# Patient Record
Sex: Male | Born: 1945 | Race: White | Hispanic: No | Marital: Married | Smoking: Former smoker
Health system: Southern US, Community
[De-identification: ages and names within clinical notes are randomized; demographics above are authoritative.]

## PROBLEM LIST (undated history)

## (undated) DIAGNOSIS — Z95810 Presence of automatic (implantable) cardiac defibrillator: Secondary | ICD-10-CM

## (undated) DIAGNOSIS — I10 Essential (primary) hypertension: Secondary | ICD-10-CM

## (undated) DIAGNOSIS — I4901 Ventricular fibrillation: Secondary | ICD-10-CM

## (undated) DIAGNOSIS — E119 Type 2 diabetes mellitus without complications: Secondary | ICD-10-CM

## (undated) DIAGNOSIS — I2510 Atherosclerotic heart disease of native coronary artery without angina pectoris: Secondary | ICD-10-CM

## (undated) DIAGNOSIS — I469 Cardiac arrest, cause unspecified: Secondary | ICD-10-CM

## (undated) DIAGNOSIS — Z950 Presence of cardiac pacemaker: Secondary | ICD-10-CM

## (undated) HISTORY — PX: VASCULAR SURGERY: SHX849

## (undated) HISTORY — PX: CARDIAC CATHETERIZATION: SHX172

## (undated) HISTORY — PX: INSERT / REPLACE / REMOVE PACEMAKER: SUR710

## (undated) HISTORY — PX: PACEMAKER INSERTION: SHX728

## (undated) HISTORY — PX: CORONARY ARTERY BYPASS GRAFT: SHX141

---

## 2016-02-22 ENCOUNTER — Encounter (HOSPITAL_COMMUNITY)
Admission: EM | Disposition: A | Discharge status: Home Health | Payer: Self-pay | Source: Home / Self Care | Attending: Internal Medicine

## 2016-02-22 ENCOUNTER — Inpatient Hospital Stay (HOSPITAL_COMMUNITY): Payer: Medicare HMO

## 2016-02-22 ENCOUNTER — Encounter (HOSPITAL_COMMUNITY): Payer: Self-pay | Admitting: Emergency Medicine

## 2016-02-22 ENCOUNTER — Inpatient Hospital Stay (HOSPITAL_COMMUNITY)
Admission: EM | Admit: 2016-02-22 | Discharge: 2016-03-02 | DRG: 280 | Disposition: A | Discharge status: Home Health | Payer: Medicare HMO | Attending: Internal Medicine | Admitting: Internal Medicine

## 2016-02-22 DIAGNOSIS — I130 Hypertensive heart and chronic kidney disease with heart failure and stage 1 through stage 4 chronic kidney disease, or unspecified chronic kidney disease: Secondary | ICD-10-CM | POA: Diagnosis present

## 2016-02-22 DIAGNOSIS — E1165 Type 2 diabetes mellitus with hyperglycemia: Secondary | ICD-10-CM | POA: Diagnosis present

## 2016-02-22 DIAGNOSIS — Z91041 Radiographic dye allergy status: Secondary | ICD-10-CM | POA: Diagnosis not present

## 2016-02-22 DIAGNOSIS — Z7902 Long term (current) use of antithrombotics/antiplatelets: Secondary | ICD-10-CM

## 2016-02-22 DIAGNOSIS — E875 Hyperkalemia: Secondary | ICD-10-CM | POA: Diagnosis present

## 2016-02-22 DIAGNOSIS — I469 Cardiac arrest, cause unspecified: Secondary | ICD-10-CM | POA: Diagnosis present

## 2016-02-22 DIAGNOSIS — I2584 Coronary atherosclerosis due to calcified coronary lesion: Secondary | ICD-10-CM | POA: Diagnosis present

## 2016-02-22 DIAGNOSIS — N183 Chronic kidney disease, stage 3 (moderate): Secondary | ICD-10-CM | POA: Diagnosis present

## 2016-02-22 DIAGNOSIS — D649 Anemia, unspecified: Secondary | ICD-10-CM | POA: Diagnosis present

## 2016-02-22 DIAGNOSIS — E1122 Type 2 diabetes mellitus with diabetic chronic kidney disease: Secondary | ICD-10-CM | POA: Diagnosis present

## 2016-02-22 DIAGNOSIS — G9340 Encephalopathy, unspecified: Secondary | ICD-10-CM | POA: Diagnosis present

## 2016-02-22 DIAGNOSIS — I462 Cardiac arrest due to underlying cardiac condition: Secondary | ICD-10-CM | POA: Diagnosis present

## 2016-02-22 DIAGNOSIS — J9600 Acute respiratory failure, unspecified whether with hypoxia or hypercapnia: Secondary | ICD-10-CM | POA: Insufficient documentation

## 2016-02-22 DIAGNOSIS — I2510 Atherosclerotic heart disease of native coronary artery without angina pectoris: Secondary | ICD-10-CM

## 2016-02-22 DIAGNOSIS — E871 Hypo-osmolality and hyponatremia: Secondary | ICD-10-CM | POA: Diagnosis present

## 2016-02-22 DIAGNOSIS — I25810 Atherosclerosis of coronary artery bypass graft(s) without angina pectoris: Secondary | ICD-10-CM | POA: Diagnosis present

## 2016-02-22 DIAGNOSIS — Z7982 Long term (current) use of aspirin: Secondary | ICD-10-CM

## 2016-02-22 DIAGNOSIS — I5023 Acute on chronic systolic (congestive) heart failure: Secondary | ICD-10-CM | POA: Diagnosis not present

## 2016-02-22 DIAGNOSIS — I255 Ischemic cardiomyopathy: Secondary | ICD-10-CM | POA: Diagnosis present

## 2016-02-22 DIAGNOSIS — Z6827 Body mass index (BMI) 27.0-27.9, adult: Secondary | ICD-10-CM

## 2016-02-22 DIAGNOSIS — I4901 Ventricular fibrillation: Secondary | ICD-10-CM | POA: Diagnosis present

## 2016-02-22 DIAGNOSIS — I509 Heart failure, unspecified: Secondary | ICD-10-CM | POA: Diagnosis not present

## 2016-02-22 DIAGNOSIS — R1310 Dysphagia, unspecified: Secondary | ICD-10-CM | POA: Diagnosis present

## 2016-02-22 DIAGNOSIS — Z794 Long term (current) use of insulin: Secondary | ICD-10-CM

## 2016-02-22 DIAGNOSIS — N179 Acute kidney failure, unspecified: Secondary | ICD-10-CM | POA: Diagnosis present

## 2016-02-22 DIAGNOSIS — J690 Pneumonitis due to inhalation of food and vomit: Secondary | ICD-10-CM | POA: Diagnosis not present

## 2016-02-22 DIAGNOSIS — Z95810 Presence of automatic (implantable) cardiac defibrillator: Secondary | ICD-10-CM

## 2016-02-22 DIAGNOSIS — E872 Acidosis: Secondary | ICD-10-CM | POA: Diagnosis present

## 2016-02-22 DIAGNOSIS — J9690 Respiratory failure, unspecified, unspecified whether with hypoxia or hypercapnia: Secondary | ICD-10-CM | POA: Insufficient documentation

## 2016-02-22 DIAGNOSIS — E669 Obesity, unspecified: Secondary | ICD-10-CM | POA: Diagnosis present

## 2016-02-22 DIAGNOSIS — I25709 Atherosclerosis of coronary artery bypass graft(s), unspecified, with unspecified angina pectoris: Secondary | ICD-10-CM | POA: Diagnosis not present

## 2016-02-22 DIAGNOSIS — Z452 Encounter for adjustment and management of vascular access device: Secondary | ICD-10-CM

## 2016-02-22 DIAGNOSIS — Z7951 Long term (current) use of inhaled steroids: Secondary | ICD-10-CM | POA: Diagnosis not present

## 2016-02-22 DIAGNOSIS — I472 Ventricular tachycardia: Secondary | ICD-10-CM | POA: Diagnosis present

## 2016-02-22 DIAGNOSIS — Z951 Presence of aortocoronary bypass graft: Secondary | ICD-10-CM | POA: Diagnosis not present

## 2016-02-22 DIAGNOSIS — I25110 Atherosclerotic heart disease of native coronary artery with unstable angina pectoris: Secondary | ICD-10-CM | POA: Diagnosis not present

## 2016-02-22 DIAGNOSIS — J9601 Acute respiratory failure with hypoxia: Secondary | ICD-10-CM | POA: Diagnosis present

## 2016-02-22 DIAGNOSIS — I214 Non-ST elevation (NSTEMI) myocardial infarction: Secondary | ICD-10-CM | POA: Diagnosis present

## 2016-02-22 DIAGNOSIS — R9401 Abnormal electroencephalogram [EEG]: Secondary | ICD-10-CM | POA: Diagnosis not present

## 2016-02-22 HISTORY — PX: CARDIAC CATHETERIZATION: SHX172

## 2016-02-22 HISTORY — DX: Ventricular fibrillation: I49.01

## 2016-02-22 HISTORY — DX: Essential (primary) hypertension: I10

## 2016-02-22 HISTORY — DX: Cardiac arrest, cause unspecified: I46.9

## 2016-02-22 HISTORY — DX: Presence of automatic (implantable) cardiac defibrillator: Z95.810

## 2016-02-22 HISTORY — DX: Atherosclerotic heart disease of native coronary artery without angina pectoris: I25.10

## 2016-02-22 HISTORY — DX: Presence of cardiac pacemaker: Z95.0

## 2016-02-22 HISTORY — DX: Type 2 diabetes mellitus without complications: E11.9

## 2016-02-22 LAB — I-STAT CHEM 8, ED
BUN: 39 mg/dL — ABNORMAL HIGH (ref 6–20)
CALCIUM ION: 1.14 mmol/L (ref 1.13–1.30)
Chloride: 103 mmol/L (ref 101–111)
Creatinine, Ser: 1.7 mg/dL — ABNORMAL HIGH (ref 0.61–1.24)
Glucose, Bld: 510 mg/dL — ABNORMAL HIGH (ref 65–99)
HCT: 39 % (ref 39.0–52.0)
Hemoglobin: 13.3 g/dL (ref 13.0–17.0)
Potassium: 6.6 mmol/L (ref 3.5–5.1)
Sodium: 133 mmol/L — ABNORMAL LOW (ref 135–145)
TCO2: 19 mmol/L (ref 0–100)

## 2016-02-22 LAB — CBC
HCT: 34.9 % — ABNORMAL LOW (ref 39.0–52.0)
HEMOGLOBIN: 11.1 g/dL — AB (ref 13.0–17.0)
MCH: 30.5 pg (ref 26.0–34.0)
MCHC: 31.8 g/dL (ref 30.0–36.0)
MCV: 95.9 fL (ref 78.0–100.0)
Platelets: 174 10*3/uL (ref 150–400)
RBC: 3.64 MIL/uL — AB (ref 4.22–5.81)
RDW: 14.4 % (ref 11.5–15.5)
WBC: 16 10*3/uL — AB (ref 4.0–10.5)

## 2016-02-22 LAB — POCT I-STAT, CHEM 8
BUN: 37 mg/dL — AB (ref 6–20)
BUN: 39 mg/dL — ABNORMAL HIGH (ref 6–20)
CHLORIDE: 100 mmol/L — AB (ref 101–111)
CHLORIDE: 101 mmol/L (ref 101–111)
Calcium, Ion: 1.19 mmol/L (ref 1.13–1.30)
Calcium, Ion: 1.22 mmol/L (ref 1.13–1.30)
Creatinine, Ser: 1.6 mg/dL — ABNORMAL HIGH (ref 0.61–1.24)
Creatinine, Ser: 1.7 mg/dL — ABNORMAL HIGH (ref 0.61–1.24)
GLUCOSE: 534 mg/dL — AB (ref 65–99)
Glucose, Bld: 613 mg/dL (ref 65–99)
HCT: 37 % — ABNORMAL LOW (ref 39.0–52.0)
HEMATOCRIT: 35 % — AB (ref 39.0–52.0)
Hemoglobin: 11.9 g/dL — ABNORMAL LOW (ref 13.0–17.0)
Hemoglobin: 12.6 g/dL — ABNORMAL LOW (ref 13.0–17.0)
POTASSIUM: 6.4 mmol/L — AB (ref 3.5–5.1)
Potassium: 6.4 mmol/L (ref 3.5–5.1)
SODIUM: 130 mmol/L — AB (ref 135–145)
SODIUM: 132 mmol/L — AB (ref 135–145)
TCO2: 22 mmol/L (ref 0–100)
TCO2: 21 mmol/L (ref 0–100)

## 2016-02-22 LAB — POCT I-STAT 3, ART BLOOD GAS (G3+)
Acid-base deficit: 6 mmol/L — ABNORMAL HIGH (ref 0.0–2.0)
Acid-base deficit: 8 mmol/L — ABNORMAL HIGH (ref 0.0–2.0)
Bicarbonate: 20 mEq/L (ref 20.0–24.0)
Bicarbonate: 20.6 mEq/L (ref 20.0–24.0)
O2 SAT: 100 %
O2 Saturation: 99 %
PCO2 ART: 35.2 mmHg (ref 35.0–45.0)
PCO2 ART: 48 mmHg — AB (ref 35.0–45.0)
PH ART: 7.227 — AB (ref 7.350–7.450)
PH ART: 7.351 (ref 7.350–7.450)
PO2 ART: 134 mmHg — AB (ref 80.0–100.0)
PO2 ART: 361 mmHg — AB (ref 80.0–100.0)
Patient temperature: 32.2
TCO2: 21 mmol/L (ref 0–100)
TCO2: 22 mmol/L (ref 0–100)

## 2016-02-22 LAB — I-STAT TROPONIN, ED: Troponin i, poc: 0.1 ng/mL (ref 0.00–0.08)

## 2016-02-22 LAB — PROTIME-INR
INR: 1.31 (ref 0.00–1.49)
Prothrombin Time: 16.4 seconds — ABNORMAL HIGH (ref 11.6–15.2)

## 2016-02-22 LAB — APTT: APTT: 41 s — AB (ref 24–37)

## 2016-02-22 SURGERY — LEFT HEART CATH AND CORONARY ANGIOGRAPHY
Anesthesia: Moderate Sedation

## 2016-02-22 MED ORDER — SODIUM CHLORIDE 0.9 % IV SOLN
INTRAVENOUS | Status: AC
  Administered 2016-02-22: via INTRAVENOUS

## 2016-02-22 MED ORDER — SODIUM CHLORIDE 0.9% FLUSH
3.0000 mL | Freq: Two times a day (BID) | INTRAVENOUS | Status: DC
  Administered 2016-02-23 – 2016-02-25 (×6): 3 mL via INTRAVENOUS

## 2016-02-22 MED ORDER — IOPAMIDOL (ISOVUE-370) INJECTION 76%
INTRAVENOUS | Status: DC | PRN
  Administered 2016-02-22: 75 mL via INTRA_ARTERIAL

## 2016-02-22 MED ORDER — HEPARIN SODIUM (PORCINE) 5000 UNIT/ML IJ SOLN: 5000.0000 [IU] | Freq: Three times a day (TID) | INTRAMUSCULAR | Status: DC

## 2016-02-22 MED ORDER — FENTANYL CITRATE (PF) 100 MCG/2ML IJ SOLN
50.0000 ug | Freq: Once | INTRAMUSCULAR | Status: AC
  Administered 2016-02-22: 50 ug via INTRAVENOUS

## 2016-02-22 MED ORDER — NITROGLYCERIN 0.4 MG SL SUBL: 0.4000 mg | SUBLINGUAL_TABLET | SUBLINGUAL | Status: DC | PRN

## 2016-02-22 MED ORDER — CISATRACURIUM BOLUS VIA INFUSION
0.0500 mg/kg | INTRAVENOUS | Status: AC | PRN
  Administered 2016-02-22: 4.4 mg via INTRAVENOUS
  Filled 2016-02-22: qty 5

## 2016-02-22 MED ORDER — SODIUM CHLORIDE 0.9 % IV SOLN
10.0000 ug/h | INTRAVENOUS | Status: DC
  Administered 2016-02-22: 10 ug/h via INTRAVENOUS
  Filled 2016-02-22: qty 50

## 2016-02-22 MED ORDER — MIDAZOLAM HCL 2 MG/2ML IJ SOLN
INTRAMUSCULAR | Status: DC | PRN
  Administered 2016-02-22: 2 mg via INTRAVENOUS
  Administered 2016-02-22 (×2): 1 mg via INTRAVENOUS

## 2016-02-22 MED ORDER — PANTOPRAZOLE SODIUM 40 MG IV SOLR
40.0000 mg | Freq: Every day | INTRAVENOUS | Status: DC
  Administered 2016-02-23 – 2016-02-27 (×5): 40 mg via INTRAVENOUS
  Filled 2016-02-22 (×5): qty 40

## 2016-02-22 MED ORDER — ONDANSETRON HCL 4 MG/2ML IJ SOLN
4.0000 mg | Freq: Four times a day (QID) | INTRAMUSCULAR | Status: DC | PRN
  Administered 2016-02-26 – 2016-02-27 (×4): 4 mg via INTRAVENOUS
  Filled 2016-02-22 (×5): qty 2

## 2016-02-22 MED ORDER — SODIUM CHLORIDE 0.9 % IV SOLN
100.0000 ug/h | INTRAVENOUS | Status: DC
  Administered 2016-02-23 (×2): 200 ug/h via INTRAVENOUS
  Filled 2016-02-22 (×4): qty 50

## 2016-02-22 MED ORDER — ASPIRIN EC 81 MG PO TBEC
81.0000 mg | DELAYED_RELEASE_TABLET | Freq: Every day | ORAL | Status: DC
  Administered 2016-02-23 – 2016-03-02 (×8): 81 mg via ORAL
  Filled 2016-02-22 (×9): qty 1

## 2016-02-22 MED ORDER — ALBUTEROL SULFATE (2.5 MG/3ML) 0.083% IN NEBU: 2.5000 mg | INHALATION_SOLUTION | RESPIRATORY_TRACT | Status: DC | PRN

## 2016-02-22 MED ORDER — LIDOCAINE HCL (PF) 1 % IJ SOLN
INTRAMUSCULAR | Status: DC | PRN
  Administered 2016-02-22: 14 mL via INTRADERMAL

## 2016-02-22 MED ORDER — ASPIRIN 300 MG RE SUPP
300.0000 mg | Freq: Once | RECTAL | Status: AC
  Administered 2016-02-22: 300 mg via RECTAL

## 2016-02-22 MED ORDER — NOREPINEPHRINE BITARTRATE 1 MG/ML IV SOLN
0.0000 ug/min | INTRAVENOUS | Status: DC
  Administered 2016-02-23: 3 ug/min via INTRAVENOUS
  Administered 2016-02-23: 4 ug/min via INTRAVENOUS
  Administered 2016-02-24: 5 ug/min via INTRAVENOUS
  Filled 2016-02-22 (×2): qty 4

## 2016-02-22 MED ORDER — INSULIN ASPART 100 UNIT/ML ~~LOC~~ SOLN
10.0000 [IU] | Freq: Once | SUBCUTANEOUS | Status: AC
  Administered 2016-02-22: 10 [IU] via INTRAVENOUS
  Filled 2016-02-22: qty 0.1

## 2016-02-22 MED ORDER — SODIUM CHLORIDE 0.9 % IV SOLN
2000.0000 mL | Freq: Once | INTRAVENOUS | Status: AC
  Administered 2016-02-22: 2000 mL via INTRAVENOUS

## 2016-02-22 MED ORDER — DEXTROSE 5 % IV SOLN
0.0000 ug/min | Freq: Once | INTRAVENOUS | Status: AC
  Filled 2016-02-22: qty 4

## 2016-02-22 MED ORDER — HEPARIN SODIUM (PORCINE) 5000 UNIT/ML IJ SOLN
4000.0000 [IU] | Freq: Once | INTRAMUSCULAR | Status: AC
  Administered 2016-02-22: 4000 [IU] via INTRAVENOUS

## 2016-02-22 MED ORDER — PROPOFOL 1000 MG/100ML IV EMUL
25.0000 ug/kg/min | INTRAVENOUS | Status: DC
  Administered 2016-02-22: 25 ug/kg/min via INTRAVENOUS
  Filled 2016-02-22: qty 100

## 2016-02-22 MED ORDER — SODIUM CHLORIDE 0.9 % IV SOLN
1.0000 mg/h | Freq: Once | INTRAVENOUS | Status: AC
  Administered 2016-02-22: 1 mg/h via INTRAVENOUS
  Filled 2016-02-22: qty 10

## 2016-02-22 MED ORDER — FENTANYL BOLUS VIA INFUSION
25.0000 ug | INTRAVENOUS | Status: DC | PRN
  Administered 2016-02-22 – 2016-02-24 (×2): 25 ug via INTRAVENOUS
  Filled 2016-02-22: qty 25

## 2016-02-22 MED ORDER — ATORVASTATIN CALCIUM 80 MG PO TABS
80.0000 mg | ORAL_TABLET | Freq: Every day | ORAL | Status: DC
  Administered 2016-02-23 – 2016-03-01 (×7): 80 mg via ORAL
  Filled 2016-02-22 (×8): qty 1

## 2016-02-22 MED ORDER — DEXTROSE 50 % IV SOLN
INTRAVENOUS | Status: DC | PRN
  Administered 2016-02-22: 1 via INTRAVENOUS

## 2016-02-22 MED ORDER — SODIUM CHLORIDE 0.9 % IV SOLN
INTRAVENOUS | Status: AC | PRN
  Administered 2016-02-22: 1000 mL via INTRAVENOUS

## 2016-02-22 MED ORDER — HEPARIN (PORCINE) IN NACL 2-0.9 UNIT/ML-% IJ SOLN
INTRAMUSCULAR | Status: DC | PRN
  Administered 2016-02-22: 1500 mL

## 2016-02-22 MED ORDER — ARTIFICIAL TEARS OP OINT
1.0000 "application " | TOPICAL_OINTMENT | Freq: Three times a day (TID) | OPHTHALMIC | Status: DC
  Administered 2016-02-22 – 2016-02-24 (×5): 1 via OPHTHALMIC
  Filled 2016-02-22: qty 3.5

## 2016-02-22 MED ORDER — SODIUM CHLORIDE 0.9 % IV SOLN: 250.0000 mL | INTRAVENOUS | Status: DC | PRN

## 2016-02-22 MED ORDER — SODIUM CHLORIDE 0.9 % IV SOLN
1.0000 ug/kg/min | INTRAVENOUS | Status: DC
  Administered 2016-02-23: 1.3 ug/kg/min via INTRAVENOUS
  Filled 2016-02-22 (×2): qty 20

## 2016-02-22 MED ORDER — CISATRACURIUM BOLUS VIA INFUSION
0.1000 mg/kg | Freq: Once | INTRAVENOUS | Status: AC
  Administered 2016-02-22: 8.9 mg via INTRAVENOUS
  Filled 2016-02-22: qty 9

## 2016-02-22 MED ORDER — SODIUM CHLORIDE 0.9% FLUSH: 3.0000 mL | INTRAVENOUS | Status: DC | PRN

## 2016-02-22 MED ORDER — SODIUM CHLORIDE 0.9 % IV SOLN: 1.0000 g | Freq: Once | INTRAVENOUS | Status: DC

## 2016-02-22 MED ORDER — FENTANYL CITRATE (PF) 100 MCG/2ML IJ SOLN
INTRAMUSCULAR | Status: DC | PRN
  Administered 2016-02-22: 25 ug via INTRAVENOUS
  Administered 2016-02-22: 50 ug via INTRAVENOUS
  Administered 2016-02-22: 25 ug via INTRAVENOUS

## 2016-02-22 MED ORDER — SODIUM CHLORIDE 0.9 % IV SOLN
INTRAVENOUS | Status: DC
  Administered 2016-02-22: 4.3 [IU]/h via INTRAVENOUS
  Filled 2016-02-22 (×2): qty 2.5

## 2016-02-22 SURGICAL SUPPLY — 8 items
CATH INFINITI 5FR MULTPACK ANG (CATHETERS) ×3 IMPLANT
KIT HEART LEFT (KITS) ×3 IMPLANT
PACK CARDIAC CATHETERIZATION (CUSTOM PROCEDURE TRAY) ×3 IMPLANT
SHEATH PINNACLE 6F 10CM (SHEATH) ×3 IMPLANT
SYR MEDRAD MARK V 150ML (SYRINGE) ×3 IMPLANT
TRANSDUCER W/STOPCOCK (MISCELLANEOUS) ×3 IMPLANT
TUBING CIL FLEX 10 FLL-RA (TUBING) ×3 IMPLANT
WIRE EMERALD 3MM-J .035X150CM (WIRE) ×3 IMPLANT

## 2016-02-22 NOTE — Consult Note (Addendum)
CARDIOLOGY CONSULT NOTE  Patient ID: Ryan Riggs, MRN: [id number], DOB/AGE: [birth year] 69 y.o. Admit date: 02/22/2016 Date of Consult: 02/22/2016  Primary Physician: No primary care provider on file. Primary Cardiologist: none Referring Physician: Dr Jodi Mourning  Chief Complaint: Cardiac Arrest Reason for Consultation: STEMI  HPI: 70 yo male with hx of CABG was at a [employer] this evening and was found down in the bathroom. He was administered bystander CPR with apparent ROSC. On EMS arrival the patient had a pulse but went into VT and required 3 shocks. He also received amiodarone. Initial EKG showed marked ST changes and a Code STEMI is called. The patient was intubated in the field and received versed and fentanyl for sedation. There is no other history available at this time. His CABG was apparently elsewhere and he has not been hospitalized in the [facility] before.   Medical History:  Past Medical History  Diagnosis Date  . Diabetes mellitus without complication (HCC)   . Hypertension   . Cardiac arrest (HCC) 02/22/2016  . Ventricular fibrillation (HCC) 02/22/2016      Surgical History:  Past Surgical History  Procedure Laterality Date  . Coronary artery bypass graft      x4  . Pacemaker insertion       Home Meds: Prior to Admission medications   Not on File    Inpatient Medications:  . norepinephrine (LEVOPHED) Adult infusion  0-40 mcg/min Intravenous Once   . fentaNYL infusion INTRAVENOUS 10 mcg/hr (02/22/16 2209)    Allergies: Allergies not on file  Social History   Social History  . Marital Status: Unknown    Spouse Name: N/A  . Number of Children: N/A  . Years of Education: N/A   Occupational History  . Not on file.   Social History Main Topics  . Smoking status: Not on file  . Smokeless tobacco: Not on file  . Alcohol Use: Not on file  . Drug Use: Not on file  . Sexual Activity: Not on file   Other Topics Concern  . Not on file   Social  History Narrative  . No narrative on file     Family History: unknown  Review of Systems: unable to obtain  Physical Exam: Blood pressure 139/79, pulse 67, SpO2 99 %. Pt is intubated, sedated, obese male HEENT: normal Neck: JVP normal - difficult to visualize Lungs: equal expansion, clear bilaterally CV: Apex is nonpalpable, RRR without murmur or gallop Abd: soft, +BS, no obvious masses Back: midline Ext: no edema Skin: warm and dry without rash Neuro: CNII-XII intact             Strength intact = bilaterally   Labs: No results for input(s): CKTOTAL, CKMB, TROPONINI in the last 72 hours. Lab Results  Component Value Date   HGB 11.9* 02/22/2016   HCT 35.0* 02/22/2016     Recent Labs Lab 02/22/16 2156  NA 130*  K 6.4*  CL 100*  BUN 37*  CREATININE 1.60*  GLUCOSE 613*   No results found for: CHOL, HDL, LDLCALC, TRIG No results found for: DDIMER  Radiology/Studies:  No results found.  EKG: NSR with nonspecific IVCD and marked anterior ST depression consider severe ischemia versus posterior infarction  Cardiac Studies: pending  ASSESSMENT AND PLAN:  1. Out-of-hospital cardiac arrest: pt resuscitated, now hemodynamically stable. Likely precipitated by cardiac ischemic event. Anticipate hypothermia.  2. NSTEMI: marked ST depression highly suggestive of severe ischemia. Emergency cardiac cath +/- PCI  3. Diabetes,  uncontrolled: start insulin gtt  4. Acute respiratory failure, secondary to #1: will consult CCM.   5. Hyperkalemia: given 10 units insulin in ER, will repeat. Kayexalate.   6. AKI: baseline creatinine unknown but suspect AKI secondary to critical illness  Dispo: pending cardiac cath  The patient is critically ill with multiple organ systems failure and requires high complexity decision making for assessment and support, frequent evaluation and titration of therapies, application of advanced monitoring technologies and extensive interpretation of  multiple databases.   Critical Care Time devoted to patient care services described in this note is 40 minutes  Signed, Tonny Bollmanooper, Daveah Varone MD, A Rosie PlaceFACC 02/22/2016, 10:13 PM  Addendum: Spoke with the patient's family after his cardiac catheterization. The patient has an ICD and when he collapsed, his ICD discharged 2 or 3 times and his son perform CPR which resuscitated him. The patient apparently has been feeling badly for about 2 weeks. He was hospitalized overnight for dehydration and fever without clear cause.  Tonny BollmanCooper, Zeke Aker 02/22/2016 10:58 PM

## 2016-02-22 NOTE — Code Documentation (Signed)
Pt arrives via EMS post arrest, was at a dance hall and went to bathroom - was found down by bystanders. CPR initiated by bystanders. Initial rhythm VT with pulses. Shocked 4 x total by EMS, 3 times at the scene at 100, 200 and 300 joules. 150MG  amiodarone drip started in the field. Given 1MG  versed and 50 mcg fentanyl given by EMS for sedation.

## 2016-02-22 NOTE — Procedures (Signed)
Arterial Catheter Insertion Procedure Note Ryan Riggs [id number] 07/03/1946  Procedure: Insertion of Arterial Catheter  Indications: Blood pressure monitoring  Procedure Details Consent: Unable to obtain consent because of emergent medical necessity. Time Out: Verified patient identification, verified procedure, site/side was marked, verified correct patient position, special equipment/implants available, medications/allergies/relevent history reviewed, required imaging and test results available.  Performed  Maximum sterile technique was used including antiseptics, cap, gloves, gown, hand hygiene, mask and sheet. Skin prep: Chlorhexidine; local anesthetic administered 20 gauge catheter was inserted into right radial artery using the Seldinger technique.  Evaluation Blood flow good; BP tracing good. Complications: No apparent complications.   Ryan Riggs, Ryan Riggs 02/22/2016

## 2016-02-22 NOTE — ED Notes (Signed)
Escorted pt to cath lab with Dr. Excell Seltzerooper. Bedside report given to cath lab team.

## 2016-02-22 NOTE — H&P (Signed)
PULMONARY / CRITICAL CARE MEDICINE   Name: Ryan Riggs MRN: [id number] DOB: [birth year]    ADMISSION DATE:  02/22/2016 CONSULTATION DATE:  02/22/16  REFERRING MD:  Dr. Excell Seltzer   CHIEF COMPLAINT:  VF Arrest   HISTORY OF PRESENT ILLNESS:  70 y/o M with PMH of DM II, CAD s/p CABG, HTN and pacemaker insertion who presented to [facility] on 5/13 after VF arrest.  He has never been hospitalized in the [facility] system.  Other medical history unknown.   The patient was reportedly at a barn dance and went to the restroom.  He was later found down by bystanders.  His son performed CPR.   He had been feeling poorly for approximately 2 weeks.  EMS activated with initial rhythm of VT.  He was shocked 2-3 times by his ICD.  EMS intubated.  He was treated with amiodarone and transported to ER for evaluation.  Initial EKG demonstrated ST changes.  The patient was taken directly to the cath lab where he was found to have graft stenosis, no interventions performed. The patient was returned to ICU for further management.  After arrival, he had an episode of vomiting concerning for aspiration.    PCCM called for ICU admission. Unknown medical, social history as no family available at time of admission.   PAST MEDICAL HISTORY :  He  has a past medical history of Diabetes mellitus without complication (HCC); Hypertension; Cardiac arrest (HCC) (02/22/2016); and Ventricular fibrillation (HCC) (02/22/2016).  PAST SURGICAL HISTORY: He  has past surgical history that includes Coronary artery bypass graft and Pacemaker insertion.  No Known Allergies  No current facility-administered medications on file prior to encounter.   No current outpatient prescriptions on file prior to encounter.    FAMILY HISTORY:  His has no family status information on file.   SOCIAL HISTORY: He    REVIEW OF SYSTEMS:  Unable to complete as patient is post arrest on ventilator and no family available at this time.  SUBJECTIVE:     VITAL SIGNS: BP 139/79 mmHg  Pulse 67  Temp(Src)   Ht  (1.778 m)  Wt 195 lb 1.7 oz (88.5 kg)  BMI 27.99 kg/m2  SpO2 99%  HEMODYNAMICS:    VENTILATOR SETTINGS: Vent Mode:  [-] PRVC FiO2 (%):  [70 %-100 %] 70 % Set Rate:  [16 bmp-20 bmp] 20 bmp Vt Set:  [580 mL] 580 mL PEEP:  [5 cmH20] 5 cmH20 Plateau Pressure:  [18 cmH20-21 cmH20] 21 cmH20  INTAKE / OUTPUT:    PHYSICAL EXAMINATION: General:  Elderly male in NAD on vent  Neuro:  Sedate, opens eyes when name called, no follow commands  HEENT:  MM pink/moist, ETT in place, no significant JVD Cardiovascular:  s1s2 rrr, distant heart tones, rrr, no m/r/g  Lungs:  Even/non-labored, lungs bilaterally clear  Abdomen:  Mild distention, bsx4 active  Musculoskeletal:  No acute deformities  Skin:  Warm/dry, no edema, I/O in RLE  LABS:  BMET  Recent Labs Lab 02/22/16 2118 02/22/16 2156  NA 133* 130*  K 6.6* 6.4*  CL 103 100*  BUN 39* 37*  CREATININE 1.70* 1.60*  GLUCOSE 510* 613*    Electrolytes No results for input(s): CALCIUM, MG, PHOS in the last 168 hours.  CBC  Recent Labs Lab 02/22/16 2118 02/22/16 2156  HGB 13.3 11.9*  HCT 39.0 35.0*    Coag's No results for input(s): APTT, INR in the last 168 hours.  Sepsis Markers No results for input(s): LATICACIDVEN,  PROCALCITON, O2SATVEN in the last 168 hours.  ABG  Recent Labs Lab 02/22/16 2156  PHART 7.227*  PCO2ART 48.0*  PO2ART 361.0*    Liver Enzymes No results for input(s): AST, ALT, ALKPHOS, BILITOT, ALBUMIN in the last 168 hours.  Cardiac Enzymes No results for input(s): TROPONINI, PROBNP in the last 168 hours.  Glucose No results for input(s): GLUCAP in the last 168 hours.  Imaging No results found.   STUDIES:  EEG 5/13 >>  ECHO 5/13 >>   CULTURES:   ANTIBIOTICS:   SIGNIFICANT EVENTS: 5/13  Admit with VT arrest, to cath lab, no interventions  LINES/TUBES: ETT 5/13 >>  R Rad Aline 5/13 >>   DISCUSSION: 70  y/o M with PMH of DM, CAD and prior CABG/AICD admitted 5/13 post VT arrest. To cath lab with prior graft disease identified, unable to perform interventions.  Returned to ICU for hypothermia protocol.  ASSESSMENT / PLAN:  PULMONARY A: Acute Respiratory Failure - in setting of cardiac arrest  Possible Aspiration - large volume emesis in ICU 5/13 pm P:   PRVC 8 cc/kg Wean PEEP / FiO2 for sats > 92% Now ABG, CXR Monitor CXR for evidence of aspiration Exchange ETT  PRN albuterol   CARDIOVASCULAR A:  VT Arrest  - out of hospital 5/13, AICD shock NSTEMI  P:  Cardiology following Hypothermia protocol  Levophed as needed for MAP >85 Continue ASA, lipitor Assess ECHO, trend troponin  ICU monitoring of hemodynamics  May need central access / currently hypertensive  RENAL A:   Acute Kidney Injury - unknown baseline AGMA - in setting of cardiac arrest  Hyperkalemia  Hyponatremia P:   Repeat CMP now Trend BMP / UOP  Replace electrolytes as indicated  Correct acidosis / hyperglycemia   GASTROINTESTINAL A:   Vomiting - 5/13 post cath lab P:   NPO  OGT to LIS PPI for SUP   HEMATOLOGIC A:   Anemia  P:  Trend CBC Heparin / SCD's for DVT prophylaxis   INFECTIOUS A:   Possible Aspiration - large volume emesis post LHC after return to ICU P:   Monitor CXR, fever curve, WBC Hold abx for now  ENDOCRINE A:   DM II - uncontrolled    P:   Insulin gtt per protocol CBG monitoring   NEUROLOGIC A:   Acute Encephalopathy - post cardiac arrest  P:   RASS goal: -5 while on hypothermia protocol  Propofol for sedation  Fentanyl for pain Assess EEG post arrest   FAMILY  - Updates: No family at bedside.    - Inter-disciplinary family meet or Palliative Care meeting due by:  5/20    Canary BrimBrandi Dayleen Beske, NP-C [facility] Pgr: [phone] or if no answer [phone] 02/22/2016, 11:07 PM

## 2016-02-22 NOTE — ED Provider Notes (Signed)
CSN: [id number]     Arrival date & time 02/22/16  2105 History   First MD Initiated Contact with Patient 02/22/16 2123     No chief complaint on file.    (Consider location/radiation/quality/duration/timing/severity/associated sxs/prior Treatment) The history is provided by the EMS personnel. The history is limited by the condition of the patient. No language interpreter was used.   Patient is a 70 year old male with history of diabetes and hypertension, CAD status post CABG 4 he presents after cardiac arrest. [facility] EMS [facility] and was found down in the bathroom by bystanders. Bystander CPR was initiated with apparent ROSC, and EMS was called. Initial rhythm on EMS arrival was ventricular tachycardia. Patient received a total of 3 shocks by EMS at 100, 200 300 J. Also received a 150 mg amiodarone load. He was intubated w/ 1 mg of Versed and 50 mcgs of fentanyl for sedation.  He has had one episode of emesis and was incontinent of stool. Code STEMI was initiated on arrival after EKG showed marked ST changes. Past Medical History  Diagnosis Date  . Diabetes mellitus without complication (HCC)   . Hypertension    Past Surgical History  Procedure Laterality Date  . Coronary artery bypass graft      x4  . Pacemaker insertion     No family history on file. Social History  Substance Use Topics  . Smoking status: None  . Smokeless tobacco: None  . Alcohol Use: None    Review of Systems  Unable to perform ROS: Intubated      Allergies  Review of patient's allergies indicates not on file.  Home Medications   Prior to Admission medications   Not on File   BP 127/51 mmHg  Pulse 39  SpO2 93% Physical Exam  Constitutional:  Intubated,  Unresponsive, incontinent of stool  HENT:  Head: Normocephalic and atraumatic.  Eyes: Pupils are equal, round, and reactive to light.  Cardiovascular: Regular rhythm and intact distal pulses.  Bradycardia present.    Pulmonary/Chest:  Coarse breath sounds bilaterally  Abdominal: Soft. He exhibits no distension.  Neurological:  Intubated, unresponsive    ED Course  Procedures (including critical care time) Labs Review Labs Reviewed  CBC - Abnormal; Notable for the following:    WBC 16.0 (*)    RBC 3.64 (*)    Hemoglobin 11.1 (*)    HCT 34.9 (*)    All other components within normal limits  CREATININE, SERUM - Abnormal; Notable for the following:    Creatinine, Ser 1.94 (*)    GFR calc non Af Amer 34 (*)    GFR calc Af Amer 39 (*)    All other components within normal limits  PROTIME-INR - Abnormal; Notable for the following:    Prothrombin Time 16.4 (*)    All other components within normal limits  APTT - Abnormal; Notable for the following:    aPTT 41 (*)    All other components within normal limits  LACTIC ACID, PLASMA - Abnormal; Notable for the following:    Lactic Acid, Venous 2.1 (*)    All other components within normal limits  GLUCOSE, CAPILLARY - Abnormal; Notable for the following:    Glucose-Capillary 490 (*)    All other components within normal limits  I-STAT CHEM 8, ED - Abnormal; Notable for the following:    Sodium 133 (*)    Potassium 6.6 (*)    BUN 39 (*)    Creatinine,  Ser 1.70 (*)    Glucose, Bld 510 (*)    All other components within normal limits  I-STAT TROPOININ, ED - Abnormal; Notable for the following:    Troponin i, poc 0.10 (*)    All other components within normal limits  POCT I-STAT, CHEM 8 - Abnormal; Notable for the following:    Sodium 130 (*)    Potassium 6.4 (*)    Chloride 100 (*)    BUN 37 (*)    Creatinine, Ser 1.60 (*)    Glucose, Bld 613 (*)    Hemoglobin 11.9 (*)    HCT 35.0 (*)    All other components within normal limits  POCT I-STAT 3, ART BLOOD GAS (G3+) - Abnormal; Notable for the following:    pH, Arterial 7.227 (*)    pCO2 arterial 48.0 (*)    pO2, Arterial 361.0 (*)    Acid-base deficit 8.0 (*)    All other components  within normal limits  POCT I-STAT 3, ART BLOOD GAS (G3+) - Abnormal; Notable for the following:    pO2, Arterial 134.0 (*)    Acid-base deficit 6.0 (*)    All other components within normal limits  POCT I-STAT, CHEM 8 - Abnormal; Notable for the following:    Sodium 132 (*)    Potassium 6.4 (*)    BUN 39 (*)    Creatinine, Ser 1.70 (*)    Glucose, Bld 534 (*)    Hemoglobin 12.6 (*)    HCT 37.0 (*)    All other components within normal limits  CBC  PROTIME-INR  APTT  BLOOD GAS, ARTERIAL  BLOOD GAS, ARTERIAL  MAGNESIUM  PHOSPHORUS  COMPREHENSIVE METABOLIC PANEL  LIPID PANEL  TROPONIN I  LACTIC ACID, PLASMA    Imaging Review No results found. I have personally reviewed and evaluated these images and lab results as part of my medical decision-making.   EKG Interpretation None      MDM   Final diagnoses:  Cardiac arrest (HCC)  Respiratory failure (HCC)    Patient with reported history CAD presents after cardiac arrest. Code STEMI initiated on arrival. Cardiology present at bedside. Patient had palpable pulses and stable vital signs on arrival. Ice packs were applied for cooling. Vital signs were established and initial labs drawn. Patient was evaluated by bedside cardiac ultrasound which showed no pericardial effusion but decreased contractility. Please see separate documentation. EKG showed ischemic ST changes in anterior leads. POC glucose 512. iSTAT troponin was elevated. I-STAT potassium was 6.6 patient was temporized with insulin. Cath Lab was activated and patient was transported for emergency cardiac catheterization.  Discussed with Dr. Jodi Mourning, ED attending.     Isa Rankin, MD 02/23/16 1610  Isa Rankin, MD 02/23/16 9604  Blane Ohara, MD 02/23/16 [phone]

## 2016-02-22 NOTE — Progress Notes (Signed)
02/22/16 2200  Clinical Encounter Type  Visited With Family  Visit Type ED;Critical Care;Code  Referral From Nurse  Spiritual Encounters  Spiritual Needs Emotional  Met family in ED and escorted family to waiting area; informed CATH team of family location; [facility] available if needed

## 2016-02-22 NOTE — Code Documentation (Signed)
Ice packs applied

## 2016-02-22 NOTE — Procedures (Signed)
Intubation Procedure Note Ryan Riggs [id number] 02/06/1946  Procedure: Intubation Indications: Respiratory insufficiency  Procedure Details Consent: Unable to obtain consent because of altered level of consciousness. Time Out: Verified patient identification, verified procedure, site/side was marked, verified correct patient position, special equipment/implants available, medications/allergies/relevent history reviewed, required imaging and test results available.  Performed  Drugs Cisatracurium drip, fentanyl drip, versed drip DL x 1 with MAC 4 blade Grade 1 view 8.0 ET tube passed through cords under direct visualization Placement confirmed with bilateral breath sounds, positive EtCO2 change and smoke in tube   Evaluation Hemodynamic Status: BP stable throughout; O2 sats: stable throughout Patient's Current Condition: stable Complications: No apparent complications Patient did tolerate procedure well. Chest X-ray ordered to verify placement.  CXR: pending.   Ryan Riggs 02/22/2016

## 2016-02-23 ENCOUNTER — Inpatient Hospital Stay (HOSPITAL_COMMUNITY): Payer: Medicare HMO

## 2016-02-23 ENCOUNTER — Encounter (HOSPITAL_COMMUNITY): Payer: Self-pay | Admitting: *Deleted

## 2016-02-23 DIAGNOSIS — I25709 Atherosclerosis of coronary artery bypass graft(s), unspecified, with unspecified angina pectoris: Secondary | ICD-10-CM

## 2016-02-23 DIAGNOSIS — J9600 Acute respiratory failure, unspecified whether with hypoxia or hypercapnia: Secondary | ICD-10-CM | POA: Insufficient documentation

## 2016-02-23 DIAGNOSIS — J9690 Respiratory failure, unspecified, unspecified whether with hypoxia or hypercapnia: Secondary | ICD-10-CM | POA: Insufficient documentation

## 2016-02-23 DIAGNOSIS — I509 Heart failure, unspecified: Secondary | ICD-10-CM

## 2016-02-23 LAB — COMPREHENSIVE METABOLIC PANEL
ALBUMIN: 3.5 g/dL (ref 3.5–5.0)
ALK PHOS: 34 U/L — AB (ref 38–126)
ALT: 58 U/L (ref 17–63)
AST: 94 U/L — AB (ref 15–41)
Anion gap: 10 (ref 5–15)
BILIRUBIN TOTAL: 1 mg/dL (ref 0.3–1.2)
BUN: 37 mg/dL — AB (ref 6–20)
CALCIUM: 8.9 mg/dL (ref 8.9–10.3)
CO2: 22 mmol/L (ref 22–32)
CREATININE: 1.68 mg/dL — AB (ref 0.61–1.24)
Chloride: 106 mmol/L (ref 101–111)
GFR calc Af Amer: 47 mL/min — ABNORMAL LOW (ref >60)
GFR, EST NON AFRICAN AMERICAN: 40 mL/min — AB (ref >60)
GLUCOSE: 254 mg/dL — AB (ref 65–99)
Potassium: 4.7 mmol/L (ref 3.5–5.1)
Sodium: 138 mmol/L (ref 135–145)
Total Protein: 6.7 g/dL (ref 6.5–8.1)

## 2016-02-23 LAB — GLUCOSE, CAPILLARY
GLUCOSE-CAPILLARY: 135 mg/dL — AB (ref 65–99)
GLUCOSE-CAPILLARY: 136 mg/dL — AB (ref 65–99)
GLUCOSE-CAPILLARY: 136 mg/dL — AB (ref 65–99)
GLUCOSE-CAPILLARY: 138 mg/dL — AB (ref 65–99)
GLUCOSE-CAPILLARY: 146 mg/dL — AB (ref 65–99)
GLUCOSE-CAPILLARY: 151 mg/dL — AB (ref 65–99)
GLUCOSE-CAPILLARY: 152 mg/dL — AB (ref 65–99)
GLUCOSE-CAPILLARY: 212 mg/dL — AB (ref 65–99)
GLUCOSE-CAPILLARY: 218 mg/dL — AB (ref 65–99)
GLUCOSE-CAPILLARY: 229 mg/dL — AB (ref 65–99)
GLUCOSE-CAPILLARY: 386 mg/dL — AB (ref 65–99)
GLUCOSE-CAPILLARY: 447 mg/dL — AB (ref 65–99)
GLUCOSE-CAPILLARY: 490 mg/dL — AB (ref 65–99)
Glucose-Capillary: 300 mg/dL — ABNORMAL HIGH (ref 65–99)
Glucose-Capillary: 347 mg/dL — ABNORMAL HIGH (ref 65–99)
Glucose-Capillary: 497 mg/dL — ABNORMAL HIGH (ref 65–99)
Glucose-Capillary: 283 mg/dL — ABNORMAL HIGH (ref 65–99)
Glucose-Capillary: 180 mg/dL — ABNORMAL HIGH (ref 65–99)
Glucose-Capillary: 163 mg/dL — ABNORMAL HIGH (ref 65–99)
Glucose-Capillary: 169 mg/dL — ABNORMAL HIGH (ref 65–99)
Glucose-Capillary: 164 mg/dL — ABNORMAL HIGH (ref 65–99)
Glucose-Capillary: 142 mg/dL — ABNORMAL HIGH (ref 65–99)
Glucose-Capillary: 141 mg/dL — ABNORMAL HIGH (ref 65–99)
Glucose-Capillary: 154 mg/dL — ABNORMAL HIGH (ref 65–99)

## 2016-02-23 LAB — BASIC METABOLIC PANEL
ANION GAP: 9 (ref 5–15)
Anion gap: 9 (ref 5–15)
Anion gap: 9 (ref 5–15)
Anion gap: 10 (ref 5–15)
BUN: 39 mg/dL — AB (ref 6–20)
BUN: 35 mg/dL — AB (ref 6–20)
BUN: 33 mg/dL — ABNORMAL HIGH (ref 6–20)
BUN: 32 mg/dL — AB (ref 6–20)
CALCIUM: 8.6 mg/dL — AB (ref 8.9–10.3)
CALCIUM: 8.5 mg/dL — AB (ref 8.9–10.3)
CALCIUM: 8.5 mg/dL — AB (ref 8.9–10.3)
CALCIUM: 8.8 mg/dL — AB (ref 8.9–10.3)
CHLORIDE: 103 mmol/L (ref 101–111)
CO2: 19 mmol/L — ABNORMAL LOW (ref 22–32)
CO2: 20 mmol/L — AB (ref 22–32)
CO2: 20 mmol/L — AB (ref 22–32)
CO2: 20 mmol/L — ABNORMAL LOW (ref 22–32)
CREATININE: 1.82 mg/dL — AB (ref 0.61–1.24)
CREATININE: 1.38 mg/dL — AB (ref 0.61–1.24)
Chloride: 107 mmol/L (ref 101–111)
Chloride: 107 mmol/L (ref 101–111)
Chloride: 106 mmol/L (ref 101–111)
Creatinine, Ser: 1.59 mg/dL — ABNORMAL HIGH (ref 0.61–1.24)
Creatinine, Ser: 1.52 mg/dL — ABNORMAL HIGH (ref 0.61–1.24)
GFR calc Af Amer: 42 mL/min — ABNORMAL LOW (ref >60)
GFR calc Af Amer: 50 mL/min — ABNORMAL LOW (ref >60)
GFR calc Af Amer: 59 mL/min — ABNORMAL LOW (ref >60)
GFR calc non Af Amer: 36 mL/min — ABNORMAL LOW (ref >60)
GFR, EST AFRICAN AMERICAN: 53 mL/min — AB (ref >60)
GFR, EST NON AFRICAN AMERICAN: 43 mL/min — AB (ref >60)
GFR, EST NON AFRICAN AMERICAN: 45 mL/min — AB (ref >60)
GFR, EST NON AFRICAN AMERICAN: 51 mL/min — AB (ref >60)
GLUCOSE: 181 mg/dL — AB (ref 65–99)
Glucose, Bld: 470 mg/dL — ABNORMAL HIGH (ref 65–99)
Glucose, Bld: 140 mg/dL — ABNORMAL HIGH (ref 65–99)
Glucose, Bld: 158 mg/dL — ABNORMAL HIGH (ref 65–99)
Potassium: 6.1 mmol/L (ref 3.5–5.1)
Potassium: 4.8 mmol/L (ref 3.5–5.1)
Potassium: 4.7 mmol/L (ref 3.5–5.1)
Potassium: 4.8 mmol/L (ref 3.5–5.1)
SODIUM: 131 mmol/L — AB (ref 135–145)
SODIUM: 136 mmol/L (ref 135–145)
SODIUM: 136 mmol/L (ref 135–145)
Sodium: 136 mmol/L (ref 135–145)

## 2016-02-23 LAB — ECHOCARDIOGRAM COMPLETE
Height: 70 in
Weight: 3121.71 oz

## 2016-02-23 LAB — CG4 I-STAT (LACTIC ACID): Lactic Acid, Venous: 2.04 mmol/L (ref 0.5–2.0)

## 2016-02-23 LAB — APTT: aPTT: 35 seconds (ref 24–37)

## 2016-02-23 LAB — PROTIME-INR
INR: 1.3 (ref 0.00–1.49)
Prothrombin Time: 16.3 seconds — ABNORMAL HIGH (ref 11.6–15.2)

## 2016-02-23 LAB — LIPID PANEL
CHOLESTEROL: 106 mg/dL (ref 0–200)
HDL: 16 mg/dL — ABNORMAL LOW (ref >40)
LDL Cholesterol: 26 mg/dL (ref 0–99)
Total CHOL/HDL Ratio: 6.6 RATIO
Triglycerides: 320 mg/dL — ABNORMAL HIGH (ref <150)
VLDL: 64 mg/dL — ABNORMAL HIGH (ref 0–40)

## 2016-02-23 LAB — POCT I-STAT, CHEM 8
BUN: 31 mg/dL — ABNORMAL HIGH (ref 6–20)
BUN: 39 mg/dL — AB (ref 6–20)
BUN: 39 mg/dL — AB (ref 6–20)
CREATININE: 1.2 mg/dL (ref 0.61–1.24)
CREATININE: 1.6 mg/dL — AB (ref 0.61–1.24)
CREATININE: 1.6 mg/dL — AB (ref 0.61–1.24)
Calcium, Ion: 1.19 mmol/L (ref 1.13–1.30)
Calcium, Ion: 1.21 mmol/L (ref 1.13–1.30)
Calcium, Ion: 1.26 mmol/L (ref 1.13–1.30)
Chloride: 104 mmol/L (ref 101–111)
Chloride: 104 mmol/L (ref 101–111)
Chloride: 105 mmol/L (ref 101–111)
GLUCOSE: 360 mg/dL — AB (ref 65–99)
GLUCOSE: 274 mg/dL — AB (ref 65–99)
Glucose, Bld: 160 mg/dL — ABNORMAL HIGH (ref 65–99)
HCT: 37 % — ABNORMAL LOW (ref 39.0–52.0)
HCT: 37 % — ABNORMAL LOW (ref 39.0–52.0)
HEMATOCRIT: 39 % (ref 39.0–52.0)
HEMOGLOBIN: 12.6 g/dL — AB (ref 13.0–17.0)
HEMOGLOBIN: 13.3 g/dL (ref 13.0–17.0)
Hemoglobin: 12.6 g/dL — ABNORMAL LOW (ref 13.0–17.0)
POTASSIUM: 5.3 mmol/L — AB (ref 3.5–5.1)
POTASSIUM: 4.8 mmol/L (ref 3.5–5.1)
Potassium: 4.7 mmol/L (ref 3.5–5.1)
SODIUM: 137 mmol/L (ref 135–145)
Sodium: 139 mmol/L (ref 135–145)
Sodium: 139 mmol/L (ref 135–145)
TCO2: 21 mmol/L (ref 0–100)
TCO2: 21 mmol/L (ref 0–100)
TCO2: 22 mmol/L (ref 0–100)

## 2016-02-23 LAB — CREATININE, SERUM
Creatinine, Ser: 1.94 mg/dL — ABNORMAL HIGH (ref 0.61–1.24)
GFR calc Af Amer: 39 mL/min — ABNORMAL LOW (ref >60)
GFR calc non Af Amer: 34 mL/min — ABNORMAL LOW (ref >60)

## 2016-02-23 LAB — CBC
HEMATOCRIT: 34.8 % — AB (ref 39.0–52.0)
HEMOGLOBIN: 11.1 g/dL — AB (ref 13.0–17.0)
MCH: 30.3 pg (ref 26.0–34.0)
MCHC: 31.9 g/dL (ref 30.0–36.0)
MCV: 95.1 fL (ref 78.0–100.0)
Platelets: 217 10*3/uL (ref 150–400)
RBC: 3.66 MIL/uL — ABNORMAL LOW (ref 4.22–5.81)
RDW: 14.3 % (ref 11.5–15.5)
WBC: 17.9 10*3/uL — ABNORMAL HIGH (ref 4.0–10.5)

## 2016-02-23 LAB — TROPONIN I: TROPONIN I: 0.35 ng/mL — AB (ref <0.031)

## 2016-02-23 LAB — PHOSPHORUS: Phosphorus: 3.9 mg/dL (ref 2.5–4.6)

## 2016-02-23 LAB — MAGNESIUM: MAGNESIUM: 2 mg/dL (ref 1.7–2.4)

## 2016-02-23 LAB — MRSA PCR SCREENING: MRSA BY PCR: NEGATIVE

## 2016-02-23 LAB — LACTIC ACID, PLASMA: LACTIC ACID, VENOUS: 2.1 mmol/L — AB (ref 0.5–2.0)

## 2016-02-23 MED ORDER — SODIUM CHLORIDE 0.9 % IV SOLN
INTRAVENOUS | Status: DC
  Administered 2016-02-27: 06:00:00 via INTRAVENOUS

## 2016-02-23 MED ORDER — SODIUM CHLORIDE 0.9 % IV SOLN
2.0000 mg/h | INTRAVENOUS | Status: DC
  Administered 2016-02-23: 2 mg/h via INTRAVENOUS
  Administered 2016-02-23 – 2016-02-24 (×3): 3 mg/h via INTRAVENOUS
  Filled 2016-02-23 (×3): qty 10

## 2016-02-23 MED ORDER — PERFLUTREN LIPID MICROSPHERE
1.0000 mL | INTRAVENOUS | Status: AC | PRN
  Administered 2016-02-23: 2 mL via INTRAVENOUS
  Filled 2016-02-23: qty 10

## 2016-02-23 MED ORDER — HEPARIN SODIUM (PORCINE) 5000 UNIT/ML IJ SOLN: 5000.0000 [IU] | Freq: Three times a day (TID) | INTRAMUSCULAR | Status: DC

## 2016-02-23 MED ORDER — MIDAZOLAM HCL 2 MG/2ML IJ SOLN
1.0000 mg | Freq: Once | INTRAMUSCULAR | Status: AC
  Administered 2016-02-23: 1 mg via INTRAVENOUS

## 2016-02-23 MED ORDER — MIDAZOLAM BOLUS VIA INFUSION
1.0000 mg | INTRAVENOUS | Status: DC | PRN
  Filled 2016-02-23: qty 1

## 2016-02-23 MED ORDER — INSULIN ASPART 100 UNIT/ML ~~LOC~~ SOLN
2.0000 [IU] | SUBCUTANEOUS | Status: DC
  Administered 2016-02-23: 2 [IU] via SUBCUTANEOUS
  Administered 2016-02-24 (×3): 6 [IU] via SUBCUTANEOUS

## 2016-02-23 MED ORDER — PERFLUTREN LIPID MICROSPHERE
INTRAVENOUS | Status: AC
  Filled 2016-02-23: qty 10

## 2016-02-23 MED ORDER — INSULIN GLARGINE 100 UNIT/ML ~~LOC~~ SOLN
30.0000 [IU] | SUBCUTANEOUS | Status: DC
  Administered 2016-02-23: 30 [IU] via SUBCUTANEOUS
  Filled 2016-02-23 (×2): qty 0.3

## 2016-02-23 MED ORDER — CHLORHEXIDINE GLUCONATE 0.12% ORAL RINSE (MEDLINE KIT)
15.0000 mL | Freq: Two times a day (BID) | OROMUCOSAL | Status: DC
  Administered 2016-02-23 – 2016-02-25 (×6): 15 mL via OROMUCOSAL

## 2016-02-23 MED ORDER — ANTISEPTIC ORAL RINSE SOLUTION (CORINZ)
7.0000 mL | OROMUCOSAL | Status: DC
  Administered 2016-02-23 – 2016-02-25 (×24): 7 mL via OROMUCOSAL

## 2016-02-23 NOTE — Procedures (Signed)
Central Venous Catheter Insertion Procedure Note Ryan HamperJoseph Riggs [id number] 11/19/1945  Procedure: Insertion of Central Venous Catheter Indications: Assessment of intravascular volume and Drug and/or fluid administration  Procedure Details Consent: Risks of procedure as well as the alternatives and risks of each were explained to the (patient/caregiver).  Consent for procedure obtained. Time Out: Verified patient identification, verified procedure, site/side was marked, verified correct patient position, special equipment/implants available, medications/allergies/relevent history reviewed, required imaging and test results available.  Performed  Maximum sterile technique was used including antiseptics, cap, gloves, gown, hand hygiene, mask and sheet. Skin prep: Chlorhexidine; local anesthetic administered A antimicrobial bonded/coated triple lumen catheter was placed in the left internal jugular vein using the Seldinger technique.  Evaluation Blood flow good Complications: No apparent complications Patient did tolerate procedure well. Chest X-ray ordered to verify placement.  CXR: pending.   Performed using ultrasound guidance.  Wire visualized in vessel under ultrasound.   Ryan DressKaty Whiteheart, NP 02/23/2016  12:01 PM

## 2016-02-23 NOTE — Progress Notes (Signed)
CRITICAL VALUE ALERT  Critical value received:  Lactic Acid 2.04, K+ 5.3  Date of notification:  02/23/2016  Time of notification:  0345  Critical value read back:Yes.    Nurse who received alert:  Owens LofflerKaziah Miller, RN  MD notified (1st page):  Deterding, MD   Time of first page:  [phone]  MD notified (2nd page):  Time of second page:  Responding MD:  Deterding, MD   Time MD responded:  [phone]

## 2016-02-23 NOTE — Progress Notes (Signed)
Propofol 95 cc witness being wasted by Darnelle SpangleKaziah RN

## 2016-02-23 NOTE — Progress Notes (Signed)
Utilization review completed.  

## 2016-02-23 NOTE — Progress Notes (Signed)
  Echocardiogram 2D Echocardiogram has been performed.  Ryan Riggs 02/23/2016, 11:26 AM

## 2016-02-23 NOTE — Progress Notes (Addendum)
Patient ID: Ryan Riggs, male   DOB: 23-Apr-1946, 70 y.o.   MRN: [id number]   SUBJECTIVE: Admitted after cardiac arrest, ICD discharged.  LHC done showing occluded native vessels, chronic occlusion SVG-RCA, patent LIMA-LAD and SVG-OM.    He is now intubated and undergoing hypothermia protocol.  On norepinephrine 5.   Scheduled Meds: . antiseptic oral rinse  7 mL Mouth Rinse 10 times per day  . artificial tears  1 application Both Eyes W7P  . aspirin EC  81 mg Oral Daily  . atorvastatin  80 mg Oral q1800  . chlorhexidine gluconate (SAGE KIT)  15 mL Mouth Rinse BID  . pantoprazole (PROTONIX) IV  40 mg Intravenous Daily  . sodium chloride flush  3 mL Intravenous Q12H   Continuous Infusions: . sodium chloride    . cisatracurium (NIMBEX) infusion 1.3 mcg/kg/min (02/23/16 0800)  . fentaNYL infusion INTRAVENOUS 200 mcg/hr (02/23/16 0800)  . insulin (NOVOLIN-R) infusion 13.7 Units/hr (02/23/16 0735)  . midazolam (VERSED) infusion 3 mg/hr (02/23/16 0800)  . norepinephrine (LEVOPHED) Adult infusion 5 mcg/min (02/23/16 0800)   PRN Meds:.sodium chloride, albuterol, dextrose, fentaNYL, midazolam, nitroGLYCERIN, ondansetron (ZOFRAN) IV, sodium chloride flush    Filed Vitals:   02/23/16 0645 02/23/16 0700 02/23/16 0728 02/23/16 0800  BP:   132/56   Pulse: 51 51 52 51  Temp:  91.4 F (33 C)  91.6 F (33.1 C)  TempSrc:    Core (Comment)  Resp: 14 14 14 14   Height:      Weight:      SpO2: 100% 100% 100% 100%    Intake/Output Summary (Last 24 hours) at 02/23/16 0832 Last data filed at 02/23/16 0800  Gross per 24 hour  Intake 738.74 ml  Output    850 ml  Net -111.26 ml    LABS: Basic Metabolic Panel:  Recent Labs  02/23/16 0135 02/23/16 0335 02/23/16 0535  NA 131* 137 139  K 6.1* 5.3* 4.8  CL 103 105 104  CO2 19*  --   --   GLUCOSE 470* 360* 274*  BUN 39* 39* 39*  CREATININE 1.82* 1.60* 1.60*  CALCIUM 8.6*  --   --    Liver Function Tests: No results for input(s):  AST, ALT, ALKPHOS, BILITOT, PROT, ALBUMIN in the last 72 hours. No results for input(s): LIPASE, AMYLASE in the last 72 hours. CBC:  Recent Labs  02/22/16 2230  02/23/16 0535 02/23/16 0651  WBC 16.0*  --   --  17.9*  HGB 11.1*  < > 12.6* 11.1*  HCT 34.9*  < > 37.0* 34.8*  MCV 95.9  --   --  95.1  PLT 174  --   --  217  < > = values in this interval not displayed. Cardiac Enzymes: No results for input(s): CKTOTAL, CKMB, CKMBINDEX, TROPONINI in the last 72 hours. BNP: Invalid input(s): POCBNP D-Dimer: No results for input(s): DDIMER in the last 72 hours. Hemoglobin A1C: No results for input(s): HGBA1C in the last 72 hours. Fasting Lipid Panel: No results for input(s): CHOL, HDL, LDLCALC, TRIG, CHOLHDL, LDLDIRECT in the last 72 hours. Thyroid Function Tests: No results for input(s): TSH, T4TOTAL, T3FREE, THYROIDAB in the last 72 hours.  Invalid input(s): FREET3 Anemia Panel: No results for input(s): VITAMINB12, FOLATE, FERRITIN, TIBC, IRON, RETICCTPCT in the last 72 hours.  RADIOLOGY: Portable Chest X-ray 1 View  02/23/2016  CLINICAL DATA:  70 year old male with history of cardiac arrest status post endotracheal tube placement. EXAM: PORTABLE CHEST 1 VIEW COMPARISON:  No priors. FINDINGS: An endotracheal tube is in place with tip 2.0 cm above the carina. A nasogastric tube is seen extending into the stomach, however, the tip of the nasogastric tube extends below the lower margin of the image. Transcutaneous defibrillator pads project over the left hemithorax. Left-sided pacemaker/AICD in place with lead tip projecting over the expected location of the right ventricular apex. Orthopedic fixation hardware in the lower cervical spine incompletely visualized. Lung volumes are low. Bibasilar opacities (left greater than right), favored to reflect areas of subsegmental atelectasis. No definite pleural effusions. Crowding of the pulmonary vasculature, without frank pulmonary edema. Heart size  appears for the mildly enlarged, likely accentuated by low lung volumes and AP portable technique. Upper mediastinal contours are within normal limits. Status post median sternotomy for CABG. IMPRESSION: 1. Postoperative changes and support apparatus, as above. 2. Low lung volumes with probable bibasilar subsegmental atelectasis. 3. Mild cardiomegaly. Electronically Signed   By: Vinnie Langton M.D.   On: 02/23/2016 07:05    PHYSICAL EXAM General: Intubated, sedated Neck: No JVD, no thyromegaly or thyroid nodule.  Lungs: Clear to auscultation bilaterally with normal respiratory effort. CV: Nondisplaced PMI.  Heart mildly brady, regular S1/S2, no S3/S4, no murmur.  No peripheral edema.   Abdomen: Soft, nontender, no hepatosplenomegaly, no distention.  Neurologic: Sedated.  Extremities: No clubbing or cyanosis.   TELEMETRY: Reviewed telemetry pt in sinus brady in 22s  ASSESSMENT AND PLAN:  70 yo with history of CABG and DM was admitted after cardiac arrest/ICD discharge.  Cath showed likely stable anatomy, no intervention.  1. CAD: s/p CABG.  LHC done 5/13 showing occluded native vessels, chronic occlusion SVG-RCA, patent LIMA-LAD and SVG-OM.  No clear culprit, doubt ACS. Probably primary arrhythmic event.  - Continue ASA 81, statin.  2. Cardiac arrest: Ventricular fibrillation with several ICD discharges.  Now undergoing hypothermia protocol.  Suspect primary arrhythmic event related to scar/ischemic cardiomyopathy.  3. Ischemic cardiomyopathy: EF < 25% on LV-gram.  Awaiting formal echo.  No HF meds at this time as cooled and hypotensive, on norepinephrine at 5.  Has St Jude ICD.  - To get CVL, can follow CVP off CVL but suspect not volume overloaded (no JVD).  - Titrate down norepinephrine as able, eventually needs beta blocker/ACEI.  - Get co-ox once CVL in place.  - Echo - Interrogate St Jude ICD tomorrow. 4. Neuro: Await weaning of sedation and completion of hypothermia protocol to  assess neurologic recovery.  5. CKD: Unsure baseline, creatinine lower today at 1.6.   Loralie Champagne 02/23/2016 8:39 AM  Discussed with family => had CABG about 10 yrs ago at [facility].  Has cardiologist in [place].  Had cardiac arrest with similar situation about 2 yrs ago.  Family thinks that EF has been about 30%.   Loralie Champagne 02/23/2016 9:54 AM

## 2016-02-23 NOTE — Progress Notes (Signed)
Elink MD paged regarding CVPs for patient. Patient does not have a central line. Verbal orders to disregard CVPs at this time.

## 2016-02-23 NOTE — Progress Notes (Signed)
CRITICAL VALUE ALERT  Critical value received:  Potassium 6.1  Date of notification:  02/23/2016  Time of notification:  0200  Critical value read back:Yes.    Nurse who received alert:  Owens LofflerKaziah Miller, RN   MD notified (1st page):  Deterding, MD   Time of first page:  0205  MD notified (2nd page):  Time of second page:  Responding MD:  Deterding, MD   Time MD responded:  [phone]

## 2016-02-23 NOTE — Progress Notes (Signed)
PULMONARY / CRITICAL CARE MEDICINE   Name: Ryan Riggs MRN: [id number] DOB: 09/27/1946    ADMISSION DATE:  02/22/2016 CONSULTATION DATE:  02/22/16  REFERRING MD:  Dr. Excell Seltzerooper   CHIEF COMPLAINT:  VF Arrest   HISTORY OF PRESENT ILLNESS:  70 y/o M with PMH of DM II, CAD s/p CABG, HTN and pacemaker insertion who [facility] after VF arrest.  He [facility] been [facility].  Other medical history unknown.   He had known CAD and has an ICD in place, history of CABG  He had a left heart cath that showed severe stenosis of all native coronary vessels, and the only vessel patent was the LIMA to LAD, left ventriculogram LVEF < 25  The patient was reportedly at a barn dance and went to the restroom.  He was later found down by bystanders.  His son performed CPR.   He had been feeling poorly for approximately 2 weeks.  EMS activated with initial rhythm of VT.  He was shocked 2-3 times by his ICD.  EMS intubated.  He was treated with amiodarone and transported to ER for evaluation.  Initial EKG demonstrated ST changes.  The patient was taken directly to the cath lab where he was found to have graft stenosis, no interventions performed. The patient was returned to ICU for further management.  After arrival, he had an episode of vomiting concerning for aspiration.       SUBJECTIVE:  Critically ill , intubated , sedated, being cooled On insulin gtt Good UO   VITAL SIGNS: BP 132/56 mmHg  Pulse 51  Temp(Src) 91.6 F (33.1 C) (Core (Comment))  Resp 14  Ht 5\' 10"  (1.778 m)  Wt 195 lb 1.7 oz (88.5 kg)  BMI 27.99 kg/m2  SpO2 100%  HEMODYNAMICS:    VENTILATOR SETTINGS: Vent Mode:  [-] PRVC FiO2 (%):  [50 %-100 %] 50 % Set Rate:  [14 bmp-20 bmp] 14 bmp Vt Set:  [580 mL] 580 mL PEEP:  [5 cmH20] 5 cmH20 Plateau Pressure:  [18 cmH20-22 cmH20] 20 cmH20  INTAKE / OUTPUT: I/O last 3 completed shifts: In: 708.3 [I.V.:708.3] Out: 800  [Urine:800]  PHYSICAL EXAMINATION: General:  Elderly male in NAD on vent  Neuro:  Sedated, paralysed HEENT:  MM pink/moist, ETT in place, no significant JVD Cardiovascular:  s1s2 rrr, distant heart tones, rrr, no m/r/g  Lungs:  Even/non-labored, lungs bilaterally clear  Abdomen:  Mild distention, bsx4 active  Musculoskeletal:  No acute deformities  Skin:  Warm/dry, no edema, I/O in RLE  LABS:  BMET  Recent Labs Lab 02/23/16 0135 02/23/16 0335 02/23/16 0535 02/23/16 0651  NA 131* 137 139 138  K 6.1* 5.3* 4.8 4.7  CL 103 105 104 106  CO2 19*  --   --  22  BUN 39* 39* 39* 37*  CREATININE 1.82* 1.60* 1.60* 1.68*  GLUCOSE 470* 360* 274* 254*    Electrolytes  Recent Labs Lab 02/23/16 0135 02/23/16 0651  CALCIUM 8.6* 8.9  MG  --  2.0  PHOS  --  3.9    CBC  Recent Labs Lab 02/22/16 2230  02/23/16 0335 02/23/16 0535 02/23/16 0651  WBC 16.0*  --   --   --  17.9*  HGB 11.1*  < > 12.6* 12.6* 11.1*  HCT 34.9*  < > 37.0* 37.0* 34.8*  PLT 174  --   --   --  217  < > = values in this  interval not displayed.  Coag's  Recent Labs Lab 02/22/16 2230 02/23/16 0652  APTT 41* 35  INR 1.31 1.30    Sepsis Markers  Recent Labs Lab 02/23/16 0001 02/23/16 0340  LATICACIDVEN 2.1* 2.04*    ABG  Recent Labs Lab 02/22/16 2156 02/22/16 2330 02/23/16 0443  PHART 7.227* 7.351 7.336*  PCO2ART 48.0* 35.2 36.5  PO2ART 361.0* 134.0* 103*    Liver Enzymes  Recent Labs Lab 02/23/16 0651  AST 94*  ALT 58  ALKPHOS 34*  BILITOT 1.0  ALBUMIN 3.5    Cardiac Enzymes No results for input(s): TROPONINI, PROBNP in the last 168 hours.  Glucose  Recent Labs Lab 02/23/16 0334 02/23/16 0433 02/23/16 0534 02/23/16 0635 02/23/16 0737 02/23/16 0837  GLUCAP 347* 300* 283* 229* 212* 218*    Imaging Portable Chest X-ray 1 View  02/23/2016  CLINICAL DATA:  70 year old male with history of cardiac arrest status post endotracheal tube placement. EXAM: PORTABLE  CHEST 1 VIEW COMPARISON:  No priors. FINDINGS: An endotracheal tube is in place with tip 2.0 cm above the carina. A nasogastric tube is seen extending into the stomach, however, the tip of the nasogastric tube extends below the lower margin of the image. Transcutaneous defibrillator pads project over the left hemithorax. Left-sided pacemaker/AICD in place with lead tip projecting over the expected location of the right ventricular apex. Orthopedic fixation hardware in the lower cervical spine incompletely visualized. Lung volumes are low. Bibasilar opacities (left greater than right), favored to reflect areas of subsegmental atelectasis. No definite pleural effusions. Crowding of the pulmonary vasculature, without frank pulmonary edema. Heart size appears for the mildly enlarged, likely accentuated by low lung volumes and AP portable technique. Upper mediastinal contours are within normal limits. Status post median sternotomy for CABG. IMPRESSION: 1. Postoperative changes and support apparatus, as above. 2. Low lung volumes with probable bibasilar subsegmental atelectasis. 3. Mild cardiomegaly. Electronically Signed   By: Trudie Reed M.D.   On: 02/23/2016 07:05     STUDIES:  LHC 5/13 showing occluded native vessels, chronic occlusion SVG-RCA, patent LIMA-LAD and SVG-OM. No clear culprit lesion EEG 5/13 >>  ECHO 5/13 >>   CULTURES:   ANTIBIOTICS:   SIGNIFICANT EVENTS: 5/13  Admit with VT arrest, to cath lab, no interventions  LINES/TUBES: ETT 5/13 >>  R Rad Aline 5/13 >>   DISCUSSION: 70 y/o M with PMH of DM, CAD and prior CABG/AICD admitted 5/13 post VT arrest. To cath lab with prior graft disease identified, no interventions.  Returned to ICU for hypothermia protocol.  ASSESSMENT / PLAN:  PULMONARY A: Acute Respiratory Failure - in setting of cardiac arrest  Possible Aspiration - large volume emesis in ICU 5/13 pm P:   PRVC 8 cc/kg PRN albuterol   CARDIOVASCULAR A:  VT  Arrest  - out of hospital 5/13, AICD shock NSTEMI  P:  Cardiology following Hypothermia protocol  Levophed as needed for MAP >85 Continue ASA, lipitor Assess ECHO   RENAL A:   Acute Kidney Injury - unknown baseline AGMA - in setting of cardiac arrest  Hyperkalemia -resolved  Hyponatremia P:   Repeat CMP now Trend BMP / UOP  Replace electrolytes as indicated  Correct acidosis / hyperglycemia   GASTROINTESTINAL A:   Vomiting - 5/13 post cath lab P:   NPO  OGT to LIS PPI for SUP   HEMATOLOGIC A:   Anemia  P:  Trend CBC Heparin / SCD's for DVT prophylaxis   INFECTIOUS A:  Possible Aspiration - large volume emesis post LHC after return to ICU P:   Monitor CXR, fever curve, WBC Start empiric unasyn since WC high  ENDOCRINE A:   DM II - uncontrolled    P:   Insulin gtt per protocol CBG monitoring   NEUROLOGIC A:   Acute Encephalopathy - post cardiac arrest  P:   RASS goal: -5 while on hypothermia protocol  Propofol for sedation  Fentanyl for pain Assess EEG post arrest   FAMILY  - Updates: No family at bedside.    - Inter-disciplinary family meet or Palliative Care meeting due by:  5/20  The patient is critically ill with multiple organ systems failure and requires high complexity decision making for assessment and support, frequent evaluation and titration of therapies, application of advanced monitoring technologies and extensive interpretation of multiple databases. Critical Care Time devoted to patient care services described in this note independent of APP time is 35 minutes.    Cyril Mourning MD. Tonny Bollman. [facility] Pager [phone] If no response call [phone]    02/23/2016, 8:57 AM

## 2016-02-23 NOTE — Progress Notes (Signed)
95 of Propofol wasted with Thayer Ohmhris, RN.

## 2016-02-24 ENCOUNTER — Encounter (HOSPITAL_COMMUNITY): Payer: Self-pay | Admitting: Cardiovascular Disease

## 2016-02-24 ENCOUNTER — Inpatient Hospital Stay (HOSPITAL_COMMUNITY): Payer: Medicare HMO

## 2016-02-24 DIAGNOSIS — J9600 Acute respiratory failure, unspecified whether with hypoxia or hypercapnia: Secondary | ICD-10-CM

## 2016-02-24 DIAGNOSIS — I469 Cardiac arrest, cause unspecified: Secondary | ICD-10-CM

## 2016-02-24 DIAGNOSIS — I25110 Atherosclerotic heart disease of native coronary artery with unstable angina pectoris: Secondary | ICD-10-CM

## 2016-02-24 DIAGNOSIS — I255 Ischemic cardiomyopathy: Secondary | ICD-10-CM

## 2016-02-24 DIAGNOSIS — J9601 Acute respiratory failure with hypoxia: Secondary | ICD-10-CM

## 2016-02-24 DIAGNOSIS — R9401 Abnormal electroencephalogram [EEG]: Secondary | ICD-10-CM

## 2016-02-24 LAB — GLUCOSE, CAPILLARY
GLUCOSE-CAPILLARY: 212 mg/dL — AB (ref 65–99)
GLUCOSE-CAPILLARY: 230 mg/dL — AB (ref 65–99)
GLUCOSE-CAPILLARY: 183 mg/dL — AB (ref 65–99)
GLUCOSE-CAPILLARY: 181 mg/dL — AB (ref 65–99)
GLUCOSE-CAPILLARY: 186 mg/dL — AB (ref 65–99)
Glucose-Capillary: 212 mg/dL — ABNORMAL HIGH (ref 65–99)
Glucose-Capillary: 199 mg/dL — ABNORMAL HIGH (ref 65–99)
Glucose-Capillary: 188 mg/dL — ABNORMAL HIGH (ref 65–99)

## 2016-02-24 LAB — BASIC METABOLIC PANEL
Anion gap: 10 (ref 5–15)
Anion gap: 13 (ref 5–15)
BUN: 29 mg/dL — AB (ref 6–20)
BUN: 30 mg/dL — AB (ref 6–20)
CALCIUM: 8.3 mg/dL — AB (ref 8.9–10.3)
CHLORIDE: 104 mmol/L (ref 101–111)
CO2: 21 mmol/L — AB (ref 22–32)
CO2: 18 mmol/L — AB (ref 22–32)
CREATININE: 1.36 mg/dL — AB (ref 0.61–1.24)
Calcium: 8.3 mg/dL — ABNORMAL LOW (ref 8.9–10.3)
Chloride: 105 mmol/L (ref 101–111)
Creatinine, Ser: 1.74 mg/dL — ABNORMAL HIGH (ref 0.61–1.24)
GFR calc Af Amer: >60 mL/min (ref >60)
GFR calc Af Amer: 45 mL/min — ABNORMAL LOW (ref >60)
GFR calc non Af Amer: 52 mL/min — ABNORMAL LOW (ref >60)
GFR calc non Af Amer: 39 mL/min — ABNORMAL LOW (ref >60)
GLUCOSE: 223 mg/dL — AB (ref 65–99)
GLUCOSE: 242 mg/dL — AB (ref 65–99)
POTASSIUM: 5.1 mmol/L (ref 3.5–5.1)
Potassium: 4.8 mmol/L (ref 3.5–5.1)
Sodium: 136 mmol/L (ref 135–145)
Sodium: 135 mmol/L (ref 135–145)

## 2016-02-24 LAB — CBC
HCT: 36.4 % — ABNORMAL LOW (ref 39.0–52.0)
Hemoglobin: 11.9 g/dL — ABNORMAL LOW (ref 13.0–17.0)
MCH: 30.7 pg (ref 26.0–34.0)
MCHC: 32.7 g/dL (ref 30.0–36.0)
MCV: 93.8 fL (ref 78.0–100.0)
PLATELETS: 206 10*3/uL (ref 150–400)
RBC: 3.88 MIL/uL — AB (ref 4.22–5.81)
RDW: 14.5 % (ref 11.5–15.5)
WBC: 18.8 10*3/uL — AB (ref 4.0–10.5)

## 2016-02-24 LAB — POCT I-STAT, CHEM 8
BUN: 32 mg/dL — ABNORMAL HIGH (ref 6–20)
CHLORIDE: 103 mmol/L (ref 101–111)
Calcium, Ion: 1.16 mmol/L (ref 1.13–1.30)
Creatinine, Ser: 1.2 mg/dL (ref 0.61–1.24)
Glucose, Bld: 214 mg/dL — ABNORMAL HIGH (ref 65–99)
HEMATOCRIT: 40 % (ref 39.0–52.0)
HEMOGLOBIN: 13.6 g/dL (ref 13.0–17.0)
POTASSIUM: 4.7 mmol/L (ref 3.5–5.1)
SODIUM: 138 mmol/L (ref 135–145)
TCO2: 21 mmol/L (ref 0–100)

## 2016-02-24 LAB — BLOOD GAS, ARTERIAL
Acid-base deficit: 5.4 mmol/L — ABNORMAL HIGH (ref 0.0–2.0)
BICARBONATE: 20.2 meq/L (ref 20.0–24.0)
Drawn by: 426241
FIO2: 0.5
LHR: 14 {breaths}/min
O2 Saturation: 98.1 %
PATIENT TEMPERATURE: 91.4
PEEP/CPAP: 5 cmH2O
PH ART: 7.336 — AB (ref 7.350–7.450)
TCO2: 21.5 mmol/L (ref 0–100)
VT: 580 mL
pCO2 arterial: 36.5 mmHg (ref 35.0–45.0)
pO2, Arterial: 103 mmHg — ABNORMAL HIGH (ref 80.0–100.0)

## 2016-02-24 LAB — CARBOXYHEMOGLOBIN
CARBOXYHEMOGLOBIN: 1.1 % (ref 0.5–1.5)
Methemoglobin: 1 % (ref 0.0–1.5)
O2 SAT: 80.2 %
TOTAL HEMOGLOBIN: 12.2 g/dL — AB (ref 13.5–18.0)

## 2016-02-24 IMAGING — CR DG CHEST 1V PORT
1 series · 1 of 1 positions shown · non-contrast
Comparison: [DATE]

CLINICAL DATA: Hypoxia

EXAM:
PORTABLE CHEST 1 VIEW

[AP]
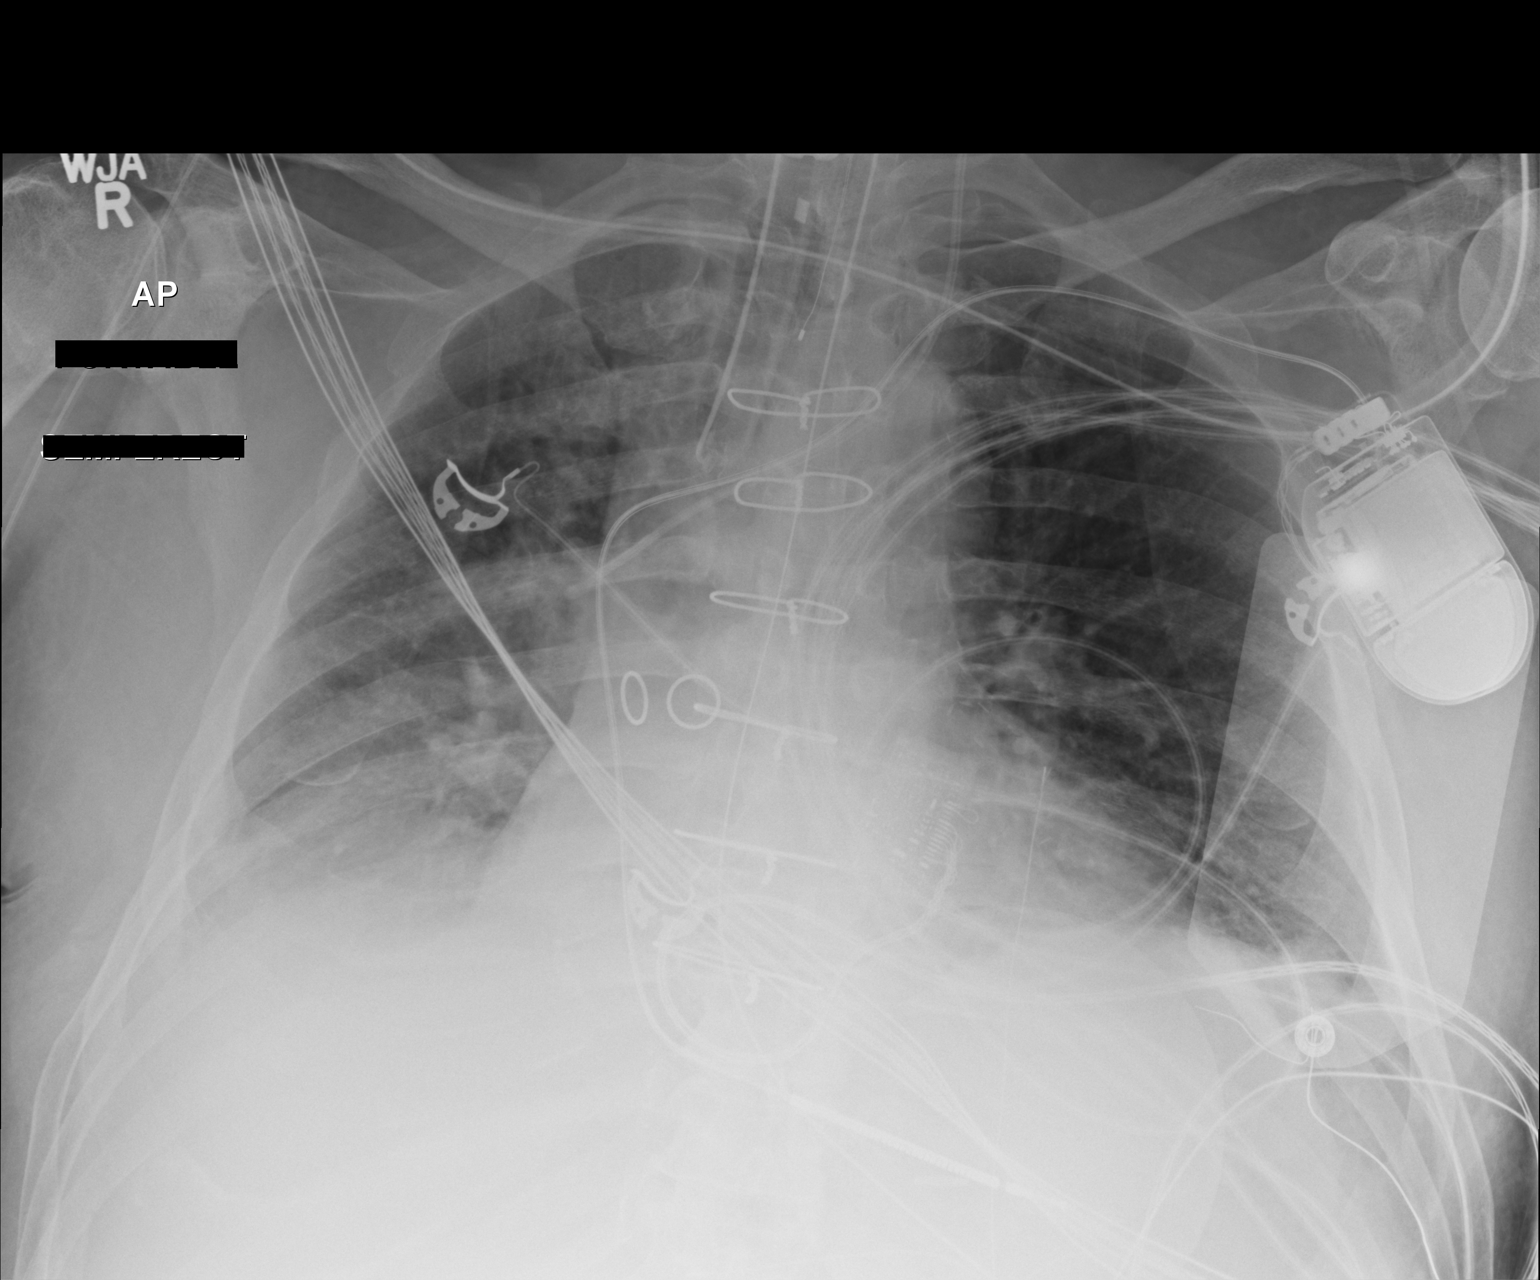

[1 of 1 positions shown; findings below may reference images not displayed]

FINDINGS: Endotracheal tube tip is 3.7 cm above the carina. Nasogastric tube
tip and side port below the diaphragm. Central catheter tip is in
the superior vena cava. Pacemaker lead is attached to the right
ventricle. No pneumothorax. There is a small pleural effusion on the
right with patchy atelectasis in the right base. The lungs elsewhere
clear. Heart is upper normal in size with pulmonary vascularity
within normal limits. No adenopathy evident.
IMPRESSION: Tube and catheter positions as described without pneumothorax. New
small right pleural effusion with right base atelectasis. Lungs
elsewhere clear. Stable cardiac silhouette.

## 2016-02-24 MED ORDER — SODIUM CHLORIDE 0.9% FLUSH
10.0000 mL | Freq: Two times a day (BID) | INTRAVENOUS | Status: DC
  Administered 2016-02-24 – 2016-02-25 (×3): 10 mL
  Administered 2016-02-26: 20 mL
  Administered 2016-02-26 – 2016-02-28 (×5): 10 mL

## 2016-02-24 MED ORDER — DEXTROSE 10 % IV SOLN: INTRAVENOUS | Status: DC | PRN

## 2016-02-24 MED ORDER — PROPOFOL 1000 MG/100ML IV EMUL
INTRAVENOUS | Status: AC
  Filled 2016-02-24: qty 100

## 2016-02-24 MED ORDER — VITAL HIGH PROTEIN PO LIQD
1000.0000 mL | ORAL | Status: DC
  Administered 2016-02-24: 1000 mL

## 2016-02-24 MED ORDER — INSULIN ASPART 100 UNIT/ML ~~LOC~~ SOLN
6.0000 [IU] | SUBCUTANEOUS | Status: DC
  Administered 2016-02-25: 6 [IU] via SUBCUTANEOUS

## 2016-02-24 MED ORDER — INSULIN ASPART 100 UNIT/ML ~~LOC~~ SOLN
2.0000 [IU] | SUBCUTANEOUS | Status: DC
  Administered 2016-02-25: 4 [IU] via SUBCUTANEOUS

## 2016-02-24 MED ORDER — INSULIN REGULAR HUMAN (CONC) 500 UNIT/ML ~~LOC~~ SOPN: 140.0000 [IU] | PEN_INJECTOR | Freq: Two times a day (BID) | SUBCUTANEOUS | Status: DC

## 2016-02-24 MED ORDER — PROPOFOL 1000 MG/100ML IV EMUL
5.0000 ug/kg/min | INTRAVENOUS | Status: DC
  Administered 2016-02-25: 5 ug/kg/min via INTRAVENOUS

## 2016-02-24 MED ORDER — SODIUM CHLORIDE 0.9% FLUSH: 10.0000 mL | INTRAVENOUS | Status: DC | PRN

## 2016-02-24 MED ORDER — INSULIN GLARGINE 100 UNIT/ML ~~LOC~~ SOLN
35.0000 [IU] | SUBCUTANEOUS | Status: DC
  Administered 2016-02-24: 35 [IU] via SUBCUTANEOUS
  Filled 2016-02-24 (×2): qty 0.35

## 2016-02-24 MED ORDER — SODIUM CHLORIDE 0.9 % IV SOLN
INTRAVENOUS | Status: AC
  Administered 2016-02-24: 1.7 [IU]/h via INTRAVENOUS
  Filled 2016-02-24: qty 2.5

## 2016-02-24 MED ORDER — SODIUM CHLORIDE 0.9 % IV SOLN
3.0000 g | Freq: Four times a day (QID) | INTRAVENOUS | Status: DC
  Administered 2016-02-24 – 2016-03-02 (×30): 3 g via INTRAVENOUS
  Filled 2016-02-24 (×36): qty 3

## 2016-02-24 MED ORDER — FENTANYL CITRATE (PF) 100 MCG/2ML IJ SOLN
INTRAMUSCULAR | Status: AC
  Filled 2016-02-24: qty 2

## 2016-02-24 MED FILL — Nitroglycerin IV Soln 100 MCG/ML in D5W: INTRA_ARTERIAL | Qty: 10 | Status: AC

## 2016-02-24 MED FILL — Verapamil HCl IV Soln 2.5 MG/ML: INTRAVENOUS | Qty: 2 | Status: AC

## 2016-02-24 NOTE — Progress Notes (Signed)
CDS called requesting information on patient's status. Update given. Verbal order to re-refer patient if there is a status change.

## 2016-02-24 NOTE — Progress Notes (Signed)
Initial Nutrition Assessment  DOCUMENTATION CODES:   Not applicable  INTERVENTION:  Initiate Vital High Protein via NGT @ goal rate of 65 ml/hr (1560 ml/d) to provide 1560 kcals and 136 g of protein (meets >/= 95%).    NUTRITION DIAGNOSIS:   Inadequate oral intake related to inability to eat as evidenced by NPO status.  GOAL:   Patient will meet greater than or equal to 90% of their needs  MONITOR:   Vent status, Labs, Weight trends, Skin, I & O's  REASON FOR ASSESSMENT:   Ventilator    ASSESSMENT:   Pt with history of DM, CAD and prior CABG/AICD admitted 5/13 post VT arrest. Admitted to ICU for hypothermia protocol. He had known CAD and has an ICD in place. Left heart cath showed severe stenosis of all native coronary vessels  5/13 pt intubated.  Pt remains on vent.  Temp (24hrs), Avg:92.4 F (33.6 C), Min:91 F (32.8 C), Max:96.6 F (35.9 C) Ve: 8.1 ml  Obtained verbal consent from CCM physician to initiate tube feeding.   NFPE: No muscle depletion, no fat depletion, no edema.  Labs reviewed; CBGs 135-212. BUN 29, creat 1.36, Ca 8.3, GFR 52 Meds reviewed.   Diet Order:  Diet NPO time specified  Skin:  Reviewed, no issues  Last BM:  unknown  Height:   Ht Readings from Last 1 Encounters:  02/22/16 5\' 10"  (1.778 m)    Weight:   Wt Readings from Last 1 Encounters:  02/22/16 195 lb 1.7 oz (88.5 kg)    Ideal Body Weight:  75.5 kg  BMI:  Body mass index is 27.99 kg/(m^2).  Estimated Nutritional Needs:   Kcal:  1620  Protein:  132  Fluid:  Per MD  EDUCATION NEEDS:   No education needs identified at this time  Beryle QuantMeredith Ashliegh Parekh, MS [facility] Dietetic Intern Pager [phone]

## 2016-02-24 NOTE — Progress Notes (Signed)
eLink Physician-Brief Progress Note Patient Name: Ryan HamperJoseph Riggs DOB: 01/10/1946 MRN: [id number]   Date of Service  02/24/2016  HPI/Events of Note  Agitated on prn sedation Cam care - calm after prn sedation which RN says is not enough   On levophed 2mcg  eICU Interventions  Start diprivna gtt     Intervention Category Major Interventions: Delirium, psychosis, severe agitation - evaluation and management  Ryan Riggs 02/24/2016, 11:52 PM

## 2016-02-24 NOTE — Progress Notes (Signed)
Per report, Patient is to receive Lantus and come off insulin gtt 2 hours after Lantus given. Patient will then be on sliding scale and tube feed coverage. His blood sugars will then be q4 hours. Orders for sliding scale, tube feed coverage, and Lantus are active.

## 2016-02-24 NOTE — Progress Notes (Signed)
Patient Name: Ryan Riggs Date of Encounter: 02/24/2016  Active Problems:   Cardiac arrest [facility])   Ventricular fibrillation ([place])   Respiratory failure ([address])   Acute respiratory failure ([place])   Primary Cardiologist: new - Dr Burt Knack Patient Profile: 70 yo male w/ hx CABG (not at [facility]), DM, HTN was admitted 05/13 w/ VT/VF arrest>>STEMI>>cath w/ med rx, EF < 25%  SUBJECTIVE: Intubated, being rewarmed, sedated  OBJECTIVE:  CVP 3; MAP 70-80 Filed Vitals:   02/24/16 0615 02/24/16 0630 02/24/16 0645 02/24/16 0700  BP:      Pulse: 82 85 86 86  Temp:    95.2 F (35.1 C)  TempSrc:      Resp: _0 Height:      Weight:      SpO2: 95% 95% 95% 95%    Intake/Output Summary (Last 24 hours) at 02/24/16 0720 Last data filed at 02/24/16 0700  Gross per 24 hour  Intake 1596.05 ml  Output   1375 ml  Net 221.05 ml   Filed Weights   02/22/16 2245  Weight: 195 lb 1.7 oz (88.5 kg)    PHYSICAL EXAM General: Well developed, well nourished, male in no acute distress. Head: Normocephalic, atraumatic.  Neck: Supple without bruits, JVD not elevated Lungs:  Resp regular and unlabored, rales bases Heart: RRR, S1, S2, no S3, S4, or murmur; no rub. Abdomen: Soft, non-tender, non-distended, BS + x 4.  Extremities: No clubbing, cyanosis, edema.  Neuro: Intubated and sedated  LABS: CBC: Recent Labs  02/23/16 0651  02/24/16 0400 02/24/16 0404  WBC 17.9*  --  18.8*  --   HGB 11.1*  < > 11.9* 13.6  HCT 34.8*  < > 36.4* 40.0  MCV 95.1  --  93.8  --   PLT 217  --  206  --   < > = values in this interval not displayed. INR:  Recent Labs  02/23/16 0652  INR 2.72   Basic Metabolic Panel: Recent Labs  02/23/16 0651  02/23/16 1515 02/23/16 1830 02/23/16 2348 02/24/16 0404  NA 138  < > 136 136 139 138  K 4.7  < > 4.7 4.8 4.7 4.7  CL 106  < > 107 106 104 103  CO2 22  < > 20* 20*  --   --   GLUCOSE 254*  < > 140* 158* 160* 214*  BUN 37*  < > 33* 32* 31* 32*    CREATININE 1.68*  < > 1.52* 1.38* 1.20 1.20  CALCIUM 8.9  < > 8.5* 8.8*  --   --   MG 2.0  --   --   --   --   --   PHOS 3.9  --   --   --   --   --   < > = values in this interval not displayed. Liver Function Tests:  Recent Labs  02/23/16 0651  AST 94*  ALT 58  ALKPHOS 34*  BILITOT 1.0  PROT 6.7  ALBUMIN 3.5   Cardiac Enzymes:  Recent Labs  02/23/16 1515  TROPONINI 0.35*    Recent Labs  02/22/16 2115  TROPIPOC 0.10*   Fasting Lipid Panel:  Recent Labs  02/23/16 0651  CHOL 106  HDL 16*  LDLCALC 26  TRIG 320*  CHOLHDL 6.6  No results found for: TSH   TELE:  SR, PVCs and pairs, 3 bt run NSVT      ECHO: 05/14  - Left ventricle:  The cavity size was mildly dilated. Wall thickness was normal. Systolic function was moderately to severely reduced. The estimated ejection fraction was in the range of 30% to 35%. Akinesis and scarring of the inferolateral, inferior, and inferoseptal myocardium. Severe hypokinesis of the apicalanterior and lateral myocardium; consistent with ischemia in the distribution of multiple vessels. Features are consistent with a pseudonormal left ventricular filling pattern, with concomitant abnormal relaxation and increased filling pressure (grade 2 diastolic dysfunction). - Mitral valve: There was mild regurgitation. - Pulmonary arteries: PA peak pressure: 33 mm Hg (S).  Radiology/Studies: Dg Chest Port 1 View 02/24/2016  CLINICAL DATA:  Hypoxia EXAM: PORTABLE CHEST 1 VIEW COMPARISON:  Feb 23, 2016 FINDINGS: Endotracheal tube tip is 3.7 cm above the carina. Nasogastric tube tip and side port below the diaphragm. Central catheter tip is in the superior vena cava. Pacemaker lead is attached to the right ventricle. No pneumothorax. There is a small pleural effusion on the right with patchy atelectasis in the right base. The lungs elsewhere clear. Heart is upper normal in size with pulmonary vascularity within normal limits. No adenopathy evident.  IMPRESSION: Tube and catheter positions as described without pneumothorax. New small right pleural effusion with right base atelectasis. Lungs elsewhere clear. Stable cardiac silhouette. Electronically Signed   By: Lowella Grip III M.D.   On: 02/24/2016 07:10   Dg Chest Port 1 View 02/23/2016  CLINICAL DATA:  Central line placement. EXAM: PORTABLE CHEST 1 VIEW COMPARISON:  02/22/2016 FINDINGS: Endotracheal tube 4.7 cm above the carina. Nasogastric tube courses into the stomach and off the inferior portion of the film as tip is not visualized. Left IJ central venous catheter has tip obliquely oriented over the expected region of the SVC. Left-sided pacemaker unchanged. Sternotomy wires unchanged. Patient slightly rotated to the right. Lungs are hypoinflated without focal consolidation or effusion. No left-sided pneumothorax. Mild stable cardiomegaly. Remainder of the exam is unchanged. IMPRESSION: Hypoinflation without acute cardiopulmonary disease. Mild stable cardiomegaly. Tubes and lines as described. Electronically Signed   By: Marin Olp M.D.   On: 02/23/2016 14:05      Current Medications:  . ampicillin-sulbactam (UNASYN) IV  3 g Intravenous Q6H  . antiseptic oral rinse  7 mL Mouth Rinse 10 times per day  . artificial tears  1 application Both Eyes K2H  . aspirin EC  81 mg Oral Daily  . atorvastatin  80 mg Oral q1800  . chlorhexidine gluconate (SAGE KIT)  15 mL Mouth Rinse BID  . insulin aspart  2-6 Units Subcutaneous Q4H  . insulin glargine  30 Units Subcutaneous Q24H  . pantoprazole (PROTONIX) IV  40 mg Intravenous Daily  . sodium chloride flush  3 mL Intravenous Q12H   . sodium chloride    . cisatracurium (NIMBEX) infusion 1.3 mcg/kg/min (02/23/16 1817)  . fentaNYL infusion INTRAVENOUS 200 mcg/hr (02/23/16 2351)  . midazolam (VERSED) infusion 3 mg/hr (02/24/16 0623)  . norepinephrine (LEVOPHED) Adult infusion 5 mcg/min (02/24/16 0630)    ASSESSMENT AND PLAN: 70 yo with  history of CABG and DM was admitted after cardiac arrest/ICD discharge. Cath showed likely stable anatomy, no intervention.   1. CAD: s/p CABG.  - LHC done 5/13 showing occluded native vessels, chronic occlusion SVG-RCA, patent LIMA-LAD and SVG-OM. - No clear culprit, doubt ACS. Probably primary arrhythmic event.  - Continue ASA 81, statin.   2. Cardiac arrest: -  Probably primary arrhythmic event related to scar/ischemic cardiomyopathy .  - Ventricular fibrillation with several ICD discharges. Now  undergoing hypothermia protocol.  - not on BB 2nd hypotension - with frequent vent ectopy, MD advise on adding amio  3. Ischemic cardiomyopathy: - EF < 25% on LV-gram. 30-35% by echo. - No HF meds at this time as cooled and hypotensive, still on norepinephrine at 5. - Has [employer] Jude ICD.  - not volume overloaded, CVP is low.  -  Currently unable to Titrate down norepinephrine as MAP goal is elevated due to hypothermia protocol -  eventually needs beta blocker/ACEI.  - Co-ox 80.2 this am.  - Echo results above - Interrogate [employer] ICD today.  4. Neuro: Await weaning of sedation and completion of hypothermia protocol to assess neurologic recovery.   5. CKD: Unsure baseline, creatinine lower 1.68-> 1.2, per CCM  Otherwise, Per CCM Active Problems:   Cardiac arrest [facility])   Ventricular fibrillation ([place])   Respiratory failure ([place])   Acute respiratory failure ([place])   Signed, Barrett, Rhonda , PA-C 7:20 AM 02/24/2016   I have personally seen and examined this patient with Rosaria Ferries, PA-C. I agree with the assessment and plan as outlined above. He is admitted with cardiac arrest. Coronary cath without focal target for PCI. Likely primary arrhythmic event. He has been cooled and is now being rewarmed. Known to have LV dysfunction. LVEF is 30%. CVP is 3. Will start CHF meds when he is stable. For now he is still on Levophed while being rewarmed. ICD is in place and will  be interrogated today. Continue supportive care for now.   Lauree Chandler 02/24/2016 7:53 AM

## 2016-02-24 NOTE — Clinical Documentation Improvement (Signed)
Critical Care  Can the diagnosis of CKD be further specified?   CKD Stage I - GFR greater than or equal to 90  CKD Stage II - GFR 60-89  CKD Stage III - GFR 30-59  CKD Stage IV - GFR 15-29  CKD Stage V - GFR < 15  ESRD (End Stage Renal Disease)  Other condition  Unable to clinically determine   Supporting Information:  CKD per 5/13 progress notes. Labs:  Bun   Creat   GFR: 5/13:   ---       1.94     34 5/14:   32       1.38     51 5/15:   30       1.74     39  Please exercise your independent, professional judgment when responding. A specific answer is not anticipated or expected.   Thank Sabino DonovanYou, Sherl Yzaguirre [facility] [employer] [facility] [phone]

## 2016-02-24 NOTE — Progress Notes (Signed)
EEG Completed; Results Pending  

## 2016-02-24 NOTE — Progress Notes (Signed)
25 versed wasted in sink and 25 fentanyl wasted in sink with Garnette CzechLexi Lawren Sexson, RN

## 2016-02-24 NOTE — Progress Notes (Signed)
PULMONARY / CRITICAL CARE MEDICINE   Name: Ryan Riggs MRN: [id number] DOB: April 13, 1946    ADMISSION DATE:  02/22/2016 CONSULTATION DATE:  02/22/16  REFERRING MD:  Dr. Excell Seltzer   CHIEF COMPLAINT:  VF Arrest   HISTORY OF PRESENT ILLNESS:  70 y/o M with PMH of DM II, CAD s/p CABG, HTN and pacemaker insertion who presented to [facility] on 5/13 after VF arrest.  He has never been hospitalized in the [facility] system.  Other medical history unknown.   He had known CAD and has an ICD in place, history of CABG  He had a left heart cath that showed severe stenosis of all native coronary vessels, and the only vessel patent was the LIMA to LAD, left ventriculogram LVEF < 25  The patient was reportedly at a barn dance and went to the restroom.  He was later found down by bystanders.  His son performed CPR.   He had been feeling poorly for approximately 2 weeks.  EMS activated with initial rhythm of VT.  He was shocked 2-3 times by his ICD.  EMS intubated.  He was treated with amiodarone and transported to ER for evaluation.  Initial EKG demonstrated ST changes.  The patient was taken directly to the cath lab where he was found to have graft stenosis, no interventions performed. The patient was returned to ICU for further management.  After arrival, he had an episode of vomiting concerning for aspiration.    SUBJECTIVE:  Being warmed, On levaphed  VITAL SIGNS: BP 139/61 mmHg  Pulse 81  Temp(Src) 94.1 F (34.5 C) (Core (Comment))  Resp 14  Ht  (1.778 m)  Wt 195 lb 1.7 oz (88.5 kg)  BMI 27.99 kg/m2  SpO2 95%  HEMODYNAMICS: CVP:  [2 mmHg-4 mmHg] 3 mmHg  VENTILATOR SETTINGS: Vent Mode:  [-] PRVC FiO2 (%):  [40 %-50 %] 40 % Set Rate:  [14 bmp] 14 bmp Vt Set:  [580 mL] 580 mL PEEP:  [5 cmH20] 5 cmH20 Plateau Pressure:  [18 cmH20-23 cmH20] 20 cmH20  INTAKE / OUTPUT: I/O last 3 completed shifts: In: 1501.5 [I.V.:1501.5] Out: 1575 [Urine:1575]  PHYSICAL EXAMINATION: General:  Elderly  male in NAD on vent  Neuro:  Sedated, Unresponsive HEENT:  Moist mucus membranes, No thyromegaly, JVD Cardiovascular:  S1s2, RRR, No MRG Lungs:  Clear, no wheeze or crackles Abdomen:  + BS Musculoskeletal:  No edema Skin:  Intact  LABS:  BMET  Recent Labs Lab 02/23/16 1055 02/23/16 1515 02/23/16 1830 02/23/16 2348 02/24/16 0404  NA 136 136 136 139 138  K 4.8 4.7 4.8 4.7 4.7  CL 107 107 106 104 103  CO2 20* 20* 20*  --   --   BUN 35* 33* 32* 31* 32*  CREATININE 1.59* 1.52* 1.38* 1.20 1.20  GLUCOSE 181* 140* 158* 160* 214*    Electrolytes  Recent Labs Lab 02/23/16 0651 02/23/16 1055 02/23/16 1515 02/23/16 1830  CALCIUM 8.9 8.5* 8.5* 8.8*  MG 2.0  --   --   --   PHOS 3.9  --   --   --     CBC  Recent Labs Lab 02/22/16 2230  02/23/16 0651 02/23/16 2348 02/24/16 0400 02/24/16 0404  WBC 16.0*  --  17.9*  --  18.8*  --   HGB 11.1*  < > 11.1* 13.3 11.9* 13.6  HCT 34.9*  < > 34.8* 39.0 36.4* 40.0  PLT 174  --  217  --  206  --   < > =  values in this interval not displayed.  Coag's  Recent Labs Lab 02/22/16 2230 02/23/16 0652  APTT 41* 35  INR 1.31 1.30    Sepsis Markers  Recent Labs Lab 02/23/16 0001 02/23/16 0340  LATICACIDVEN 2.1* 2.04*    ABG  Recent Labs Lab 02/22/16 2156 02/22/16 2330 02/23/16 0443  PHART 7.227* 7.351 7.336*  PCO2ART 48.0* 35.2 36.5  PO2ART 361.0* 134.0* 103*    Liver Enzymes  Recent Labs Lab 02/23/16 0651  AST 94*  ALT 58  ALKPHOS 34*  BILITOT 1.0  ALBUMIN 3.5    Cardiac Enzymes  Recent Labs Lab 02/23/16 1515  TROPONINI 0.35*    Glucose  Recent Labs Lab 02/23/16 1936 02/23/16 2038 02/23/16 2140 02/23/16 2241 02/23/16 2346 02/24/16 0400  GLUCAP 135* 141* 151* 154* 146* 212*    Imaging Dg Chest Port 1 View  02/23/2016  CLINICAL DATA:  Central line placement. EXAM: PORTABLE CHEST 1 VIEW COMPARISON:  02/22/2016 FINDINGS: Endotracheal tube 4.7 cm above the carina. Nasogastric tube  courses into the stomach and off the inferior portion of the film as tip is not visualized. Left IJ central venous catheter has tip obliquely oriented over the expected region of the SVC. Left-sided pacemaker unchanged. Sternotomy wires unchanged. Patient slightly rotated to the right. Lungs are hypoinflated without focal consolidation or effusion. No left-sided pneumothorax. Mild stable cardiomegaly. Remainder of the exam is unchanged. IMPRESSION: Hypoinflation without acute cardiopulmonary disease. Mild stable cardiomegaly. Tubes and lines as described. Electronically Signed   By: Elberta Fortis M.D.   On: 02/23/2016 14:05     STUDIES:  LHC 5/13 showing occluded native vessels, chronic occlusion SVG-RCA, patent LIMA-LAD and SVG-OM. No clear culprit lesion EEG 5/13 >>  ECHO 5/13 >> LVEF 30-35%, grade 2 diastolic dysfunction  CULTURES:  ANTIBIOTICS:  SIGNIFICANT EVENTS: 5/13  Admit with VT arrest, to cath lab, no interventions  LINES/TUBES: ETT 5/13 >>  R Rad Aline 5/13 >>   DISCUSSION: 70 y/o M with PMH of DM, CAD and prior CABG/AICD admitted 5/13 post VT arrest. To cath lab with prior graft disease identified, no interventions.  Returned to ICU for hypothermia protocol.  ASSESSMENT / PLAN:  PULMONARY A: Acute Respiratory Failure - in setting of cardiac arrest  Possible Aspiration - large volume emesis in ICU 5/13 pm P:   Continue current vent setting PRN albuterol   CARDIOVASCULAR A:  VT Arrest  - out of hospital 5/13, AICD shock NSTEMI  P:  Cardiology following Hypothermia protocol > rewarming Levophed as needed for MAP >85 Continue ASA, lipitor  RENAL A:   Acute Kidney Injury - unknown baseline AGMA - in setting of cardiac arrest  Hyperkalemia -resolved  Hyponatremia P:   Trend Cr, urine output Replace electrolytes as indicated     GASTROINTESTINAL A:   Vomiting - 5/13 post cath lab P:   NPO  PPI for SUP   HEMATOLOGIC A:   Anemia  P:  Trend  CBC Heparin / SCD's for DVT prophylaxis   INFECTIOUS A:   Possible Aspiration - large volume emesis post LHC after return to ICU P:   Monitor CXR, fever curve, WBC Start unasyn for aspiration Check procalcitonin.   ENDOCRINE A:   DM II - uncontrolled    P:   Off insulin drip. SSI coverage.  CBG monitoring   NEUROLOGIC A:   Acute Encephalopathy - post cardiac arrest  P:   Propofol for sedation  Fentanyl for pain  FAMILY  - Updates: No family  at bedside.   - Inter-disciplinary family meet or Palliative Care meeting due by:  5/20  Critical care time- 35 mins.  Chilton GreathousePraveen Penelopi Mikrut MD [place] [facility] Pager [phone] If no answer or after 3pm call: [phone] 02/24/2016, 5:37 AM

## 2016-02-24 NOTE — Procedures (Signed)
HPI:  70 y/o, s/p VF arrest, on hypothermia protocol  TECHNICAL SUMMARY:  A multichannel referential and bipolar montage EEG using the standard international 10-20 system was performed on the patient described as unresponsive.  There is no occipital dominant rhythm.  4-4.5 Hz activity can be seen throughout all head regions with 2 Hz activity noted frontally.  There is significant interference noted over the right hemisphere, likely from room instrumentation.  ACTIVATION:  Stepwise photic stimulation and hyperventilation were not performed.  EPILEPTIFORM ACTIVITY:  There were no spikes, sharp waves or paroxysmal activity.  SLEEP:  No sleep noted  CARDIAC:  The EKG lead was not well recorded, but the rhythm appeared to be regular.  IMPRESSION:  This is an abnormal EEG demonstrating a moderately severe diffuse slowing of electrocerebral activity.  This can be seen in a wide variety of encephalopathic state including those of a toxic, metabolic, or degenerative nature.  This can also be consistent with a sedation effect.  There was no obvious focal, hemispheric, or lateralizing future but, as above, there was significant interference artifact noted over the right hemisphere, likely due to room instrumentation.  This made interpretation over the right hemisphere difficult.  There was no epileptiform activity recorded.

## 2016-02-25 ENCOUNTER — Inpatient Hospital Stay (HOSPITAL_COMMUNITY): Payer: Medicare HMO

## 2016-02-25 LAB — GLUCOSE, CAPILLARY
GLUCOSE-CAPILLARY: 120 mg/dL — AB (ref 65–99)
GLUCOSE-CAPILLARY: 144 mg/dL — AB (ref 65–99)
GLUCOSE-CAPILLARY: 174 mg/dL — AB (ref 65–99)
GLUCOSE-CAPILLARY: 185 mg/dL — AB (ref 65–99)
GLUCOSE-CAPILLARY: 198 mg/dL — AB (ref 65–99)
GLUCOSE-CAPILLARY: 287 mg/dL — AB (ref 65–99)
GLUCOSE-CAPILLARY: 290 mg/dL — AB (ref 65–99)
GLUCOSE-CAPILLARY: 297 mg/dL — AB (ref 65–99)
GLUCOSE-CAPILLARY: 345 mg/dL — AB (ref 65–99)
Glucose-Capillary: 187 mg/dL — ABNORMAL HIGH (ref 65–99)
Glucose-Capillary: 189 mg/dL — ABNORMAL HIGH (ref 65–99)
Glucose-Capillary: 282 mg/dL — ABNORMAL HIGH (ref 65–99)
Glucose-Capillary: 278 mg/dL — ABNORMAL HIGH (ref 65–99)
Glucose-Capillary: 265 mg/dL — ABNORMAL HIGH (ref 65–99)
Glucose-Capillary: 290 mg/dL — ABNORMAL HIGH (ref 65–99)
Glucose-Capillary: 299 mg/dL — ABNORMAL HIGH (ref 65–99)
Glucose-Capillary: 240 mg/dL — ABNORMAL HIGH (ref 65–99)
Glucose-Capillary: 221 mg/dL — ABNORMAL HIGH (ref 65–99)
Glucose-Capillary: 184 mg/dL — ABNORMAL HIGH (ref 65–99)

## 2016-02-25 LAB — CBC
HCT: 31.6 % — ABNORMAL LOW (ref 39.0–52.0)
Hemoglobin: 10.4 g/dL — ABNORMAL LOW (ref 13.0–17.0)
MCH: 31.3 pg (ref 26.0–34.0)
MCHC: 32.9 g/dL (ref 30.0–36.0)
MCV: 95.2 fL (ref 78.0–100.0)
PLATELETS: 103 10*3/uL — AB (ref 150–400)
RBC: 3.32 MIL/uL — ABNORMAL LOW (ref 4.22–5.81)
RDW: 15.2 % (ref 11.5–15.5)
WBC: 11.9 10*3/uL — AB (ref 4.0–10.5)

## 2016-02-25 LAB — PHOSPHORUS: PHOSPHORUS: 2.1 mg/dL — AB (ref 2.5–4.6)

## 2016-02-25 LAB — BASIC METABOLIC PANEL
ANION GAP: 10 (ref 5–15)
BUN: 33 mg/dL — ABNORMAL HIGH (ref 6–20)
CALCIUM: 8 mg/dL — AB (ref 8.9–10.3)
CO2: 22 mmol/L (ref 22–32)
CREATININE: 1.55 mg/dL — AB (ref 0.61–1.24)
Chloride: 105 mmol/L (ref 101–111)
GFR, EST AFRICAN AMERICAN: 51 mL/min — AB (ref >60)
GFR, EST NON AFRICAN AMERICAN: 44 mL/min — AB (ref >60)
GLUCOSE: 295 mg/dL — AB (ref 65–99)
Potassium: 4.6 mmol/L (ref 3.5–5.1)
Sodium: 137 mmol/L (ref 135–145)

## 2016-02-25 LAB — BLOOD GAS, ARTERIAL
Acid-base deficit: 3.6 mmol/L — ABNORMAL HIGH (ref 0.0–2.0)
Bicarbonate: 20.8 mEq/L (ref 20.0–24.0)
Drawn by: 437071
FIO2: 0.4
LHR: 14 {breaths}/min
MECHVT: 580 mL
O2 Saturation: 97.5 %
PATIENT TEMPERATURE: 98.6
PCO2 ART: 36.4 mmHg (ref 35.0–45.0)
PEEP: 5 cmH2O
PO2 ART: 95.3 mmHg (ref 80.0–100.0)
TCO2: 21.9 mmol/L (ref 0–100)
pH, Arterial: 7.374 (ref 7.350–7.450)

## 2016-02-25 LAB — TRIGLYCERIDES: Triglycerides: 280 mg/dL — ABNORMAL HIGH (ref <150)

## 2016-02-25 LAB — MAGNESIUM: MAGNESIUM: 1.7 mg/dL (ref 1.7–2.4)

## 2016-02-25 LAB — PROCALCITONIN: Procalcitonin: 1.37 ng/mL

## 2016-02-25 MED ORDER — INSULIN GLARGINE 100 UNIT/ML ~~LOC~~ SOLN
35.0000 [IU] | Freq: Two times a day (BID) | SUBCUTANEOUS | Status: DC
  Filled 2016-02-25 (×2): qty 0.35

## 2016-02-25 MED ORDER — CETYLPYRIDINIUM CHLORIDE 0.05 % MT LIQD
7.0000 mL | Freq: Two times a day (BID) | OROMUCOSAL | Status: DC
Start: 2016-02-25 — End: 2016-03-02
  Administered 2016-02-25 – 2016-03-02 (×12): 7 mL via OROMUCOSAL

## 2016-02-25 MED ORDER — INSULIN ASPART 100 UNIT/ML ~~LOC~~ SOLN: 0.0000 [IU] | SUBCUTANEOUS | Status: DC

## 2016-02-25 MED ORDER — SODIUM CHLORIDE 0.9 % IV SOLN
INTRAVENOUS | Status: DC
  Administered 2016-02-26: 3 [IU]/h via INTRAVENOUS
  Administered 2016-02-26 (×2): 2.9 [IU]/h via INTRAVENOUS
  Administered 2016-02-26: 3 [IU]/h via INTRAVENOUS
  Filled 2016-02-25: qty 2.5

## 2016-02-25 MED ORDER — AMIODARONE HCL IN DEXTROSE 360-4.14 MG/200ML-% IV SOLN
60.0000 mg/h | INTRAVENOUS | Status: AC
  Administered 2016-02-25: 60 mg/h via INTRAVENOUS
  Filled 2016-02-25 (×2): qty 200

## 2016-02-25 MED ORDER — AMIODARONE HCL IN DEXTROSE 360-4.14 MG/200ML-% IV SOLN
30.0000 mg/h | INTRAVENOUS | Status: DC
  Administered 2016-02-25 – 2016-02-26 (×4): 30 mg/h via INTRAVENOUS
  Filled 2016-02-25 (×3): qty 200

## 2016-02-25 MED ORDER — ASPIRIN 300 MG RE SUPP
150.0000 mg | Freq: Once | RECTAL | Status: AC
  Administered 2016-02-25: 150 mg via RECTAL
  Filled 2016-02-25: qty 1

## 2016-02-25 MED ORDER — FENTANYL CITRATE (PF) 100 MCG/2ML IJ SOLN
25.0000 ug | INTRAMUSCULAR | Status: DC | PRN
  Administered 2016-02-25: 50 ug via INTRAVENOUS
  Administered 2016-02-25: 25 ug via INTRAVENOUS
  Administered 2016-02-25 – 2016-02-26 (×14): 50 ug via INTRAVENOUS
  Administered 2016-02-27: 25 ug via INTRAVENOUS
  Administered 2016-02-27: 50 ug via INTRAVENOUS
  Administered 2016-02-27: 25 ug via INTRAVENOUS
  Administered 2016-02-27 – 2016-02-29 (×2): 50 ug via INTRAVENOUS
  Filled 2016-02-25 (×21): qty 2

## 2016-02-25 NOTE — Progress Notes (Signed)
eLink Physician-Brief Progress Note Patient Name: Ryan Riggs DOB: 07/28/1946 MRN: [id number]   Date of Service  02/25/2016  HPI/Events of Note  Sugar 287 on phase 3 ICU hyperglycemia protoco  eICU Interventions  Back on insulin gtt Day rounder to address correct order set     Intervention Category Major Interventions: Hyperglycemia - active titration of insulin therapy  [name] 02/25/2016, 4:38 AM

## 2016-02-25 NOTE — Progress Notes (Signed)
Patient blood sugar above 250. Per protocol and Ramaswamy, MD, insulin gtt restarted. Will pass on to AM RN to address sliding scale order set.

## 2016-02-25 NOTE — Progress Notes (Addendum)
PULMONARY / CRITICAL CARE MEDICINE   Name: Ryan Riggs MRN: [id number] DOB: 05/24/1946    ADMISSION DATE:  02/22/2016 CONSULTATION DATE:  02/22/16  REFERRING MD:  Dr. Excell Seltzerooper   CHIEF COMPLAINT:  VF Arrest   HISTORY OF PRESENT ILLNESS:  70 y/o M with PMH of DM II, CAD s/p CABG, HTN and pacemaker insertion who presented to [facility] on 5/13 after VF arrest.  He has never been hospitalized in the [facility].  Other medical history unknown.   He had known CAD and has an ICD in place, history of CABG  He had a left heart cath that showed severe stenosis of all native coronary vessels, and the only vessel patent was the LIMA to LAD, left ventriculogram LVEF < 25  The patient was reportedly at a barn dance and went to the restroom.  He was later found down by bystanders.  His son performed CPR.   He had been feeling poorly for approximately 2 weeks.  EMS activated with initial rhythm of VT.  He was shocked 2-3 times by his ICD.  EMS intubated.  He was treated with amiodarone and transported to ER for evaluation.  Initial EKG demonstrated ST changes.  The patient was taken directly to the cath lab where he was found to have graft stenosis, no interventions performed. The patient was returned to ICU for further management.  After arrival, he had an episode of vomiting concerning for aspiration.    SUBJECTIVE:  Awake, no distress. Doing well on weaning trial.  VITAL SIGNS: BP 124/85 mmHg  Pulse 117  Temp(Src) 98.4 F (36.9 C) (Core (Comment))  Resp 21  Ht 5\' 10"  (1.778 m)  Wt 195 lb 14.4 oz (88.86 kg)  BMI 28.11 kg/m2  SpO2 97%  HEMODYNAMICS: CVP:  [5 mmHg-7 mmHg] 7 mmHg  VENTILATOR SETTINGS: Vent Mode:  [-] CPAP;PSV FiO2 (%):  [40 %] 40 % Set Rate:  [14 bmp] 14 bmp Vt Set:  [580 mL] 580 mL PEEP:  [5 cmH20] 5 cmH20 Pressure Support:  [5 cmH20] 5 cmH20 Plateau Pressure:  [10 cmH20-20 cmH20] 10 cmH20  INTAKE / OUTPUT: I/O last 3 completed shifts: In: 2992.8 [I.V.:1600.5;  NG/GT:892.3; IV Piggyback:500] Out: 2070 [Urine:1670; Emesis/NG output:400]  PHYSICAL EXAMINATION: General:  Elderly male in NAD on vent  Neuro:  Awake, follows commands, No focal deficits. HEENT:  Moist mucus membranes, No thyromegaly, JVD Cardiovascular:  S1s2, RRR, No MRG Lungs:  Clear, no wheeze or crackles Abdomen:  + BS Musculoskeletal:  No edema Skin:  Intact  LABS:  BMET  Recent Labs Lab 02/24/16 0800 02/24/16 1214 02/25/16 0400  NA 136 135 137  K 4.8 5.1 4.6  CL 105 104 105  CO2 21* 18* 22  BUN 29* 30* 33*  CREATININE 1.36* 1.74* 1.55*  GLUCOSE 223* 242* 295*    Electrolytes  Recent Labs Lab 02/23/16 0651  02/24/16 0800 02/24/16 1214 02/25/16 0400  CALCIUM 8.9  < > 8.3* 8.3* 8.0*  MG 2.0  --   --   --  1.7  PHOS 3.9  --   --   --  2.1*  < > = values in this interval not displayed.  CBC  Recent Labs Lab 02/23/16 0651  02/24/16 0400 02/24/16 0404 02/25/16 0400  WBC 17.9*  --  18.8*  --  11.9*  HGB 11.1*  < > 11.9* 13.6 10.4*  HCT 34.8*  < > 36.4* 40.0 31.6*  PLT 217  --  206  --  103*  < > = values in this interval not displayed.  Coag's  Recent Labs Lab 02/22/16 2230 02/23/16 0652  APTT 41* 35  INR 1.31 1.30    Sepsis Markers  Recent Labs Lab 02/23/16 0001 02/23/16 0340  LATICACIDVEN 2.1* 2.04*    ABG  Recent Labs Lab 02/22/16 2330 02/23/16 0443 02/25/16 0342  PHART 7.351 7.336* 7.374  PCO2ART 35.2 36.5 36.4  PO2ART 134.0* 103* 95.3    Liver Enzymes  Recent Labs Lab 02/23/16 0651  AST 94*  ALT 58  ALKPHOS 34*  BILITOT 1.0  ALBUMIN 3.5    Cardiac Enzymes  Recent Labs Lab 02/23/16 1515  TROPONINI 0.35*    Glucose  Recent Labs Lab 02/25/16 0010 02/25/16 0402 02/25/16 0542 02/25/16 0643 02/25/16 0745 02/25/16 0849  GLUCAP 198* 282* 297* 290* 278* 287*    Imaging Dg Chest Port 1 View  02/25/2016  CLINICAL DATA:  Acute respiratory failure.  Shortness of breath. EXAM: PORTABLE CHEST 1 VIEW  COMPARISON:  02/24/2016. FINDINGS: Endotracheal tube, NG tube, left IJ line in stable position. Cardiac pacer in stable position. Prior CABG. Stable cardiomegaly. Bibasilar atelectasis and/or infiltrates again noted. A right mid lung field infiltrate noted on today's exam. No prominent pleural effusion or pneumothorax. Prior cervical spine fusion . IMPRESSION: 1.  Lines and tubes in stable position. 2. Persistent bibasilar atelectasis and/or infiltrates. Developing infiltrate right mid lung. 3. Cardiac pacer stable position. Prior CABG. Stable cardiomegaly. Electronically Signed   By: Maisie Fus  Register   On: 02/25/2016 07:14     STUDIES:  LHC 5/13 showing occluded native vessels, chronic occlusion SVG-RCA, patent LIMA-LAD and SVG-OM. No clear culprit lesion EEG 5/13 >>  ECHO 5/13 >> LVEF 30-35%, grade 2 diastolic dysfunction  CULTURES:  ANTIBIOTICS:  SIGNIFICANT EVENTS: 5/13  Admit with VT arrest, to cath lab, no interventions  LINES/TUBES: ETT 5/13 >>  R Rad Aline 5/13 >>   DISCUSSION: 70 y/o M with PMH of DM, CAD and prior CABG/AICD admitted 5/13 post VT arrest. To cath lab with prior graft disease identified, no interventions.  Returned to ICU for hypothermia protocol.  ASSESSMENT / PLAN:  PULMONARY A: Acute Respiratory Failure - in setting of cardiac arrest  Possible Aspiration - large volume emesis in ICU 5/13 pm P:   Continue current vent setting Hopeful for extubation today PRN albuterol   CARDIOVASCULAR A:  VT Arrest  - out of hospital 5/13, AICD shock NSTEMI  P:  Cardiology following Off levophed Start amio as per cardiology. ICD interrogation today Continue ASA, lipitor  RENAL A:   Acute Kidney Injury - unknown baseline > improving AGMA - in setting of cardiac arrest  Hyperkalemia -resolved  Hyponatremia P:   Trend Cr, urine output Replace electrolytes as indicated     GASTROINTESTINAL A:   Vomiting - 5/13 post cath lab P:   NPO  PPI for SUP    HEMATOLOGIC A:   Anemia  P:  Trend CBC Heparin / SCD's for DVT prophylaxis   INFECTIOUS A:   Possible Aspiration - large volume emesis post LHC after return to ICU P:   Monitor CXR, fever curve, WBC Start unasyn for aspiration Check procalcitonin.   ENDOCRINE A:   DM II - uncontrolled    P:   Off and on insulin drip. When pt is ready to come of drip we will put him back on U500 insulin at 120 units bid. He is on a high dose of 150 units bid at home.  SSI coverage with resistant scale. CBG monitoring   NEUROLOGIC A:   Acute Encephalopathy - post cardiac arrest  P:   Off sedation Fentanyl for pain  FAMILY  - Updates: Family updated  - Inter-disciplinary family meet or Palliative Care meeting due by:  5/20.  Interdisciplinary Goals of Care Family Meeting  Date carried out:: 02/25/2016  Location of the meeting: Bedside  Member's involved: Physician and Bedside Registered Nurse  Durable Power of Attorney or acting medical decision maker: Wife. Also present were his daughter and eldest son    Discussion: We discussed goals of care for Ryan Hamper . He has a good quality of life at baseline. Family want him to remain full code.  Code status: Full Code  Disposition: Continue current acute care  Time spent for the meeting: 10 mins.  Total critical care time- 35 mins.  Chilton Greathouse MD [place] [facility] Pager [phone] If no answer or after 3pm call: [phone] 02/25/2016, 10:12 AM

## 2016-02-25 NOTE — Progress Notes (Signed)
     SUBJECTIVE: Pt sedated, intubated.   BP 131/78 mmHg  Pulse 90  Temp(Src) 96.1 F (35.6 C) (Core (Comment))  Resp 18  Ht _0  (1.778 m)  Wt 195 lb 14.4 oz (88.86 kg)  BMI 28.11 kg/m2  SpO2 98%  Intake/Output Summary (Last 24 hours) at 02/25/16 0827 Last data filed at 02/25/16 0700  Gross per 24 hour  Intake 2091.29 ml  Output   1245 ml  Net 846.29 ml    PHYSICAL EXAM General: sedated, intubated.  Psych:  sedated Neck: No JVD. No masses noted.  Lungs: Clear bilaterally with no wheezes or rhonci noted.  Heart: RRR with no murmurs noted. Abdomen: Bowel sounds are present. Soft, non-tender.  Extremities: No lower extremity edema.   LABS: Basic Metabolic Panel:  Recent Labs  02/23/16 0651  02/24/16 1214 02/25/16 0400  NA 138  < > 135 137  K 4.7  < > 5.1 4.6  CL 106  < > 104 105  CO2 22  < > 18* 22  GLUCOSE 254*  < > 242* 295*  BUN 37*  < > 30* 33*  CREATININE 1.68*  < > 1.74* 1.55*  CALCIUM 8.9  < > 8.3* 8.0*  MG 2.0  --   --  1.7  PHOS 3.9  --   --  2.1*  < > = values in this interval not displayed. CBC:  Recent Labs  02/24/16 0400 02/24/16 0404 02/25/16 0400  WBC 18.8*  --  11.9*  HGB 11.9* 13.6 10.4*  HCT 36.4* 40.0 31.6*  MCV 93.8  --  95.2  PLT 206  --  103*   Cardiac Enzymes:  Recent Labs  02/23/16 1515  TROPONINI 0.35*   Fasting Lipid Panel:  Recent Labs  02/23/16 0651 02/25/16 0055  CHOL 106  --   HDL 16*  --   LDLCALC 26  --   TRIG 320* 280*  CHOLHDL 6.6  --     Current Meds: . ampicillin-sulbactam (UNASYN) IV  3 g Intravenous Q6H  . antiseptic oral rinse  7 mL Mouth Rinse 10 times per day  . aspirin EC  81 mg Oral Daily  . atorvastatin  80 mg Oral q1800  . chlorhexidine gluconate (SAGE KIT)  15 mL Mouth Rinse BID  . insulin aspart  2-6 Units Subcutaneous Q4H  . insulin aspart  6 Units Subcutaneous Q4H  . insulin glargine  35 Units Subcutaneous Q24H  . pantoprazole (PROTONIX) IV  40 mg Intravenous Daily  . sodium  chloride flush  10-40 mL Intracatheter Q12H  . sodium chloride flush  3 mL Intravenous Q12H     ASSESSMENT AND PLAN: 70 yo with history of CABG and DM was admitted after cardiac arrest/ICD discharge. Cath showed likely stable anatomy, no intervention was performed.   1. CAD s/p CABG: Cardiac cath 02/22/16 with no clear culprit. (occluded native vessels, chronic occlusion SVG-RCA, patent LIMA-LAD and SVG-OM). Continue ASA 81, statin.   2. Cardiac arrest: Probably primary arrhythmic event related to scar/ischemic cardiomyopathy. Ventricular fibrillation with several ICD discharges. He has been cooled per protocol and now rewarmed. With frequent PVCs, will start IV amiodarone. Will get ICD interrogated today.   3. Ischemic cardiomyopathy: LVEF=30-35% by echo. This is chronic. Beta blocker and Ace-inh on hold for now.   4. Neuro: Await weaning of sedation and completion of hypothermia protocol to assess neurologic recovery.     Lauree Chandler  5/16/20178:27 AM

## 2016-02-25 NOTE — Procedures (Signed)
Extubation Procedure Note  Patient Details:   Name: Baron HamperJoseph Sox DOB: 07/28/1946 MRN: [id number]   Airway Documentation:     Evaluation  O2 sats: stable throughout Complications: No apparent complications Patient did tolerate procedure well. Bilateral Breath Sounds: Rhonchi   Yes   Positive cuff leak noted prior to extubation. Post extubation - patient placed on nasal cannula 4 L with humidity, no stridor noted, E Link notified.  Rayburn FeltJean S Danalee Flath 02/25/2016, 10:44 AM

## 2016-02-25 NOTE — Progress Notes (Signed)
Patient agitated and restless. Attempted to pull at tubes and lines and get out of bed. Son at bedside. Elink called and notified. prn of Fentanyl given per order. Order to start Propofol was given. Will continue to monitor patient.

## 2016-02-25 NOTE — Progress Notes (Signed)
eLink Physician-Brief Progress Note Patient Name: Ryan Riggs DOB: 09/05/1946 MRN: [id number]   Date of Service  02/25/2016  HPI/Events of Note  Pain  eICU Interventions  Fentanyl     Intervention Category Major Interventions: Other:  Voncille Simm 02/25/2016, 3:48 PM

## 2016-02-26 ENCOUNTER — Encounter (HOSPITAL_COMMUNITY): Payer: Self-pay | Admitting: *Deleted

## 2016-02-26 ENCOUNTER — Inpatient Hospital Stay (HOSPITAL_COMMUNITY): Payer: Medicare HMO

## 2016-02-26 DIAGNOSIS — I5023 Acute on chronic systolic (congestive) heart failure: Secondary | ICD-10-CM

## 2016-02-26 LAB — GLUCOSE, CAPILLARY
GLUCOSE-CAPILLARY: 112 mg/dL — AB (ref 65–99)
GLUCOSE-CAPILLARY: 118 mg/dL — AB (ref 65–99)
GLUCOSE-CAPILLARY: 123 mg/dL — AB (ref 65–99)
GLUCOSE-CAPILLARY: 134 mg/dL — AB (ref 65–99)
GLUCOSE-CAPILLARY: 135 mg/dL — AB (ref 65–99)
GLUCOSE-CAPILLARY: 155 mg/dL — AB (ref 65–99)
GLUCOSE-CAPILLARY: 175 mg/dL — AB (ref 65–99)
GLUCOSE-CAPILLARY: 197 mg/dL — AB (ref 65–99)
GLUCOSE-CAPILLARY: 223 mg/dL — AB (ref 65–99)
Glucose-Capillary: 123 mg/dL — ABNORMAL HIGH (ref 65–99)
Glucose-Capillary: 149 mg/dL — ABNORMAL HIGH (ref 65–99)
Glucose-Capillary: 150 mg/dL — ABNORMAL HIGH (ref 65–99)
Glucose-Capillary: 159 mg/dL — ABNORMAL HIGH (ref 65–99)
Glucose-Capillary: 160 mg/dL — ABNORMAL HIGH (ref 65–99)
Glucose-Capillary: 198 mg/dL — ABNORMAL HIGH (ref 65–99)

## 2016-02-26 LAB — CBC
HEMATOCRIT: 30.6 % — AB (ref 39.0–52.0)
HEMOGLOBIN: 9.9 g/dL — AB (ref 13.0–17.0)
MCH: 30.6 pg (ref 26.0–34.0)
MCHC: 32.4 g/dL (ref 30.0–36.0)
MCV: 94.4 fL (ref 78.0–100.0)
Platelets: 99 10*3/uL — ABNORMAL LOW (ref 150–400)
RBC: 3.24 MIL/uL — AB (ref 4.22–5.81)
RDW: 15.2 % (ref 11.5–15.5)
WBC: 7.8 10*3/uL (ref 4.0–10.5)

## 2016-02-26 LAB — BASIC METABOLIC PANEL
ANION GAP: 10 (ref 5–15)
BUN: 31 mg/dL — AB (ref 6–20)
CHLORIDE: 105 mmol/L (ref 101–111)
CO2: 25 mmol/L (ref 22–32)
Calcium: 8.5 mg/dL — ABNORMAL LOW (ref 8.9–10.3)
Creatinine, Ser: 1.28 mg/dL — ABNORMAL HIGH (ref 0.61–1.24)
GFR calc Af Amer: >60 mL/min (ref >60)
GFR calc non Af Amer: 56 mL/min — ABNORMAL LOW (ref >60)
GLUCOSE: 162 mg/dL — AB (ref 65–99)
POTASSIUM: 4.2 mmol/L (ref 3.5–5.1)
Sodium: 140 mmol/L (ref 135–145)

## 2016-02-26 LAB — MAGNESIUM: Magnesium: 1.8 mg/dL (ref 1.7–2.4)

## 2016-02-26 LAB — PHOSPHORUS: Phosphorus: 1.9 mg/dL — ABNORMAL LOW (ref 2.5–4.6)

## 2016-02-26 LAB — PROCALCITONIN: PROCALCITONIN: 0.94 ng/mL

## 2016-02-26 MED ORDER — INSULIN GLARGINE 100 UNIT/ML ~~LOC~~ SOLN
100.0000 [IU] | Freq: Every day | SUBCUTANEOUS | Status: DC
  Filled 2016-02-26: qty 1

## 2016-02-26 MED ORDER — CARVEDILOL 3.125 MG PO TABS
3.1250 mg | ORAL_TABLET | Freq: Two times a day (BID) | ORAL | Status: DC
  Administered 2016-02-26 – 2016-03-02 (×10): 3.125 mg via ORAL
  Filled 2016-02-26 (×10): qty 1

## 2016-02-26 MED ORDER — INSULIN GLARGINE 100 UNIT/ML ~~LOC~~ SOLN
30.0000 [IU] | SUBCUTANEOUS | Status: DC
  Administered 2016-02-26: 30 [IU] via SUBCUTANEOUS
  Filled 2016-02-26: qty 0.3

## 2016-02-26 MED ORDER — INSULIN GLARGINE 100 UNIT/ML ~~LOC~~ SOLN
70.0000 [IU] | Freq: Once | SUBCUTANEOUS | Status: AC
  Administered 2016-02-26: 70 [IU] via SUBCUTANEOUS
  Filled 2016-02-26: qty 0.7

## 2016-02-26 MED ORDER — INSULIN ASPART 100 UNIT/ML ~~LOC~~ SOLN
0.0000 [IU] | SUBCUTANEOUS | Status: DC
  Administered 2016-02-26: 7 [IU] via SUBCUTANEOUS
  Administered 2016-02-26 – 2016-02-27 (×3): 4 [IU] via SUBCUTANEOUS
  Administered 2016-02-27: 11 [IU] via SUBCUTANEOUS
  Administered 2016-02-27 (×4): 7 [IU] via SUBCUTANEOUS

## 2016-02-26 MED ORDER — FUROSEMIDE 10 MG/ML IJ SOLN
40.0000 mg | Freq: Two times a day (BID) | INTRAMUSCULAR | Status: DC
  Administered 2016-02-26 – 2016-02-28 (×5): 40 mg via INTRAVENOUS
  Filled 2016-02-26 (×5): qty 4

## 2016-02-26 MED ORDER — SODIUM PHOSPHATES 45 MMOLE/15ML IV SOLN
30.0000 mmol | Freq: Once | INTRAVENOUS | Status: AC
  Administered 2016-02-26: 30 mmol via INTRAVENOUS
  Filled 2016-02-26: qty 10

## 2016-02-26 MED ORDER — DEXTROSE 10 % IV SOLN: INTRAVENOUS | Status: DC | PRN

## 2016-02-26 MED ORDER — PROCHLORPERAZINE EDISYLATE 5 MG/ML IJ SOLN
5.0000 mg | Freq: Once | INTRAMUSCULAR | Status: AC
  Administered 2016-02-26: 5 mg via INTRAVENOUS
  Filled 2016-02-26: qty 1

## 2016-02-26 NOTE — Care Management Important Message (Signed)
Important Message  Patient Details  Name: Ryan Riggs MRN: [id number] Date of Birth: 03/17/1946   Medicare Important Message Given:  Yes    Hanley HaysDowell, Aviella Disbrow T, RN 02/26/2016, 10:10 AM

## 2016-02-26 NOTE — Evaluation (Signed)
Clinical/Bedside Swallow Evaluation Patient Details  Name: Ryan Riggs MRN: [id number] Date of Birth: Jan 10, 1946  Today's Date: 02/26/2016 Time: SLP Start Time (ACUTE ONLY): 1100 SLP Stop Time (ACUTE ONLY): 1115 SLP Time Calculation (min) (ACUTE ONLY): 15 min  Past Medical History:  Past Medical History  Diagnosis Date  . Diabetes mellitus without complication (HCC)   . Hypertension   . Cardiac arrest (HCC) 02/22/2016  . Ventricular fibrillation (HCC) 02/22/2016  . Coronary artery disease   . AICD (automatic cardioverter/defibrillator) present   . Presence of permanent cardiac pacemaker    Past Surgical History:  Past Surgical History  Procedure Laterality Date  . Coronary artery bypass graft      x4  . Pacemaker insertion    . Insert / replace / remove pacemaker    . Vascular surgery    . Cardiac catheterization    . Cardiac catheterization N/A 02/22/2016    Procedure: Left Heart Cath and Coronary Angiography;  Surgeon: Tonny Bollman, MD;  Location: [facility] INVASIVE CV LAB;  Service: Cardiovascular;  Laterality: N/A;   HPI:  70 y/o M with PMH of DM II, CAD s/p CABG, HTN and pacemaker insertion who presented to [facility] on 5/13 after VF arrest. Large volume emesis with concern for aspiration 5/13. ETT 5/13-5/16.    Assessment / Plan / Recommendation Clinical Impression  Pt presents with an acute reversible dysphagia s/p three-day intubation.  He is groggy, oriented to self but not surroundings.  Pt demonstrates failure on three oz water test, suggesting high aspiration risk.  Consumption of purees marked by multiple subswallows per bolus, likely due to generalized weakness.  For today, maintain NPO status excluding meds whole in puree and limited ice chips.  SLP to return next date to assess for improvements and readiness for PO diet.  Pt's wife present, verbalizes understanding.     Aspiration Risk  Moderate aspiration risk    Diet Recommendation   npo except meds whole in puree,  limited ice chips  Medication Administration: Whole meds with puree    Other  Recommendations Oral Care Recommendations: Oral care QID;Oral care prior to ice chip/H20   Follow up Recommendations   (tba)    Frequency and Duration min 3x week  2 weeks       Prognosis Prognosis for Safe Diet Advancement: Good      Swallow Study   General Date of Onset: 02/22/16 HPI: 70 y/o M with PMH of DM II, CAD s/p CABG, HTN and pacemaker insertion who presented to [facility] on 5/13 after VF arrest. Large volume emesis with concern for aspiration 5/13. ETT 5/13-5/16.  Type of Study: Bedside Swallow Evaluation Previous Swallow Assessment: no Diet Prior to this Study: NPO Temperature Spikes Noted: No Respiratory Status: Nasal cannula History of Recent Intubation: Yes Length of Intubations (days): 3 days Date extubated: 02/25/16 Behavior/Cognition: Alert;Confused;Lethargic/Drowsy Oral Cavity Assessment: Within Functional Limits Oral Care Completed by SLP: No Oral Cavity - Dentition: Edentulous Vision: Functional for self-feeding Self-Feeding Abilities: Able to feed self Patient Positioning: Upright in bed Baseline Vocal Quality: Low vocal intensity Volitional Cough: Strong Volitional Swallow: Able to elicit    Oral/Motor/Sensory Function Overall Oral Motor/Sensory Function: Within functional limits   Ice Chips Ice chips: Within functional limits   Thin Liquid Thin Liquid: Impaired Presentation: Cup;Self Fed Pharyngeal  Phase Impairments: Multiple swallows;Wet Vocal Quality;Throat Clearing - Immediate;Suspected delayed Swallow    Nectar Thick Nectar Thick Liquid: Not tested   Honey Thick Honey Thick Liquid: Not tested  Puree Puree: Impaired Presentation: Spoon Pharyngeal Phase Impairments: Multiple swallows   Solid   GO   Solid: Not tested       Derward Marple L. Samson Fredericouture, [place] CCC/SLP Pager [phone]  Blenda MountsCouture, Tinia Oravec Laurice 02/26/2016,11:27 AM

## 2016-02-26 NOTE — Progress Notes (Addendum)
PULMONARY / CRITICAL CARE MEDICINE   Name: Ryan Riggs MRN: [id number] DOB: Apr 13, 1946    ADMISSION DATE:  02/22/2016 CONSULTATION DATE:  02/22/16  REFERRING MD:  Dr. Excell Seltzer   CHIEF COMPLAINT:  VF Arrest   HISTORY OF PRESENT ILLNESS:  70 y/o M with PMH of DM II, CAD s/p CABG, HTN and pacemaker insertion who presented to [facility] on 5/13 after VF arrest.  He has never been hospitalized in the [facility] system.  Other medical history unknown.   He had known CAD and has an ICD in place, history of CABG  He had a left heart cath that showed severe stenosis of all native coronary vessels, and the only vessel patent was the LIMA to LAD, left ventriculogram LVEF < 25  The patient was reportedly at a barn dance and went to the restroom.  He was later found down by bystanders.  His son performed CPR.   He had been feeling poorly for approximately 2 weeks.  EMS activated with initial rhythm of VT.  He was shocked 2-3 times by his ICD.  EMS intubated.  He was treated with amiodarone and transported to ER for evaluation.  Initial EKG demonstrated ST changes.  The patient was taken directly to the cath lab where he was found to have graft stenosis, no interventions performed. The patient was returned to ICU for further management.  After arrival, he had an episode of vomiting concerning for aspiration.    SUBJECTIVE:  Awake, no distress. Extubated, stable.  VITAL SIGNS: BP 133/61 mmHg  Pulse 71  Temp(Src) 97.8 F (36.6 C) (Oral)  Resp 14  Ht  (1.778 m)  Wt 191 lb 2.2 oz (86.7 kg)  BMI 27.43 kg/m2  SpO2 96%  HEMODYNAMICS: CVP:  [5 mmHg-9 mmHg] 9 mmHg  VENTILATOR SETTINGS:    INTAKE / OUTPUT: I/O last 3 completed shifts: In: 2872.8 [I.V.:1200.3; NG/GT:1072.5; IV Piggyback:600] Out: 5020 [Urine:4620; Emesis/NG output:400]  PHYSICAL EXAMINATION: General:  Elderly male in NAD Neuro:  Awake, follows commands, No focal deficits. HEENT:  Moist mucus membranes, No thyromegaly,  JVD Cardiovascular:  S1s2, RRR, No MRG Lungs:  Clear, no wheeze or crackles Abdomen:  + BS Musculoskeletal:  No edema Skin:  Intact  LABS:  BMET  Recent Labs Lab 02/24/16 1214 02/25/16 0400 02/26/16 0430  NA 135 137 140  K 5.1 4.6 4.2  CL 104 105 105  CO2 18* 22 25  BUN 30* 33* 31*  CREATININE 1.74* 1.55* 1.28*  GLUCOSE 242* 295* 162*    Electrolytes  Recent Labs Lab 02/23/16 0651  02/24/16 1214 02/25/16 0400 02/26/16 0430  CALCIUM 8.9  < > 8.3* 8.0* 8.5*  MG 2.0  --   --  1.7 1.8  PHOS 3.9  --   --  2.1* 1.9*  < > = values in this interval not displayed.  CBC  Recent Labs Lab 02/24/16 0400 02/24/16 0404 02/25/16 0400 02/26/16 0430  WBC 18.8*  --  11.9* 7.8  HGB 11.9* 13.6 10.4* 9.9*  HCT 36.4* 40.0 31.6* 30.6*  PLT 206  --  103* 99*    Coag's  Recent Labs Lab 02/22/16 2230 02/23/16 0652  APTT 41* 35  INR 1.31 1.30    Sepsis Markers  Recent Labs Lab 02/23/16 0001 02/23/16 0340 02/25/16 1130 02/26/16 0430  LATICACIDVEN 2.1* 2.04*  --   --   PROCALCITON  --   --  1.37 0.94    ABG  Recent Labs Lab 02/22/16 2330 02/23/16  0443 02/25/16 0342  PHART 7.351 7.336* 7.374  PCO2ART 35.2 36.5 36.4  PO2ART 134.0* 103* 95.3    Liver Enzymes  Recent Labs Lab 02/23/16 0651  AST 94*  ALT 58  ALKPHOS 34*  BILITOT 1.0  ALBUMIN 3.5    Cardiac Enzymes  Recent Labs Lab 02/23/16 1515  TROPONINI 0.35*    Glucose  Recent Labs Lab 02/26/16 0253 02/26/16 0509 02/26/16 0617 02/26/16 0721 02/26/16 0825 02/26/16 1151  GLUCAP 149* 135* 155* 150* 160* 198*    Imaging Dg Chest Port 1 View  02/26/2016  CLINICAL DATA:  Acute respiratory failure; MI and CABG 3 days ago EXAM: PORTABLE CHEST 1 VIEW COMPARISON:  Portable chest x-ray of Feb 22, 2016 and Feb 25, 2016 FINDINGS: There has been interval extubation of the trachea and of the esophagus. The lungs are hypoinflated. Confluent alveolar opacity persists in the right mid lung. Small  amounts of pleural fluid blunt the costophrenic angles. There is left lower lobe atelectasis. The cardiac silhouette is enlarged. The pulmonary vascularity is mildly engorged. The sternal wires are intact. The pacemaker -defibrillator is in stable position. The left internal jugular venous catheter tip projects over the proximal SVC. IMPRESSION: CHF with mild pulmonary interstitial edema. Confluent right perihilar opacity, stable, likely reflecting pneumonia. Bibasilar atelectasis and small effusions. The remaining support tubes and devices are in reasonable position. Electronically Signed   By: David  [place]azil[place] M.D.   On: 02/26/2016 07:22     STUDIES:  LHC 5/13 showing occluded native vessels, chronic occlusion SVG-RCA, patent LIMA-LAD and SVG-OM. No clear culprit lesion EEG 5/13 >>  ECHO 5/13 >> LVEF 30-35%, grade 2 diastolic dysfunction  CULTURES:  ANTIBIOTICS:  SIGNIFICANT EVENTS: 5/13  Admit with VT arrest, to cath lab, no interventions  LINES/TUBES: ETT 5/13 >> 5/16 R Rad Aline 5/13 >>   DISCUSSION: 70 y/o M with PMH of DM, CAD and prior CABG/AICD admitted 5/13 post VT arrest. To cath lab with prior graft disease identified, no interventions.  Returned to ICU for hypothermia protocol.  ASSESSMENT / PLAN:  PULMONARY A: Acute Respiratory Failure - in setting of cardiac arrest  Possible Aspiration - large volume emesis in ICU 5/13 pm P:   Wean down of as tolerated. PRN albuterol   CARDIOVASCULAR A:  VT Arrest  - out of hospital 5/13, AICD shock NSTEMI  P:  Cardiology following Off levophed On  amio as per cardiology. Continue ASA, lipitor  RENAL A:   Acute Kidney Injury - unknown baseline > improving AGMA - in setting of cardiac arrest  Hyperkalemia -resolved  Hyponatremia P:   Trend Cr, urine output Replace electrolytes as indicated    GASTROINTESTINAL A:   Vomiting - 5/13 post cath lab P:   NPO  PPI for SUP   HEMATOLOGIC A:   Anemia  P:  Trend  CBC Heparin / SCD's for DVT prophylaxis   INFECTIOUS A:   Possible Aspiration - large volume emesis post LHC after return to ICU P:   Monitor CXR, fever curve, WBC On unasyn for aspiration Check procalcitonin.   ENDOCRINE A:   DM II - uncontrolled    P:   Off insulin drip today. Will treat with lantus 100 units daily for now When he is eating he can go back to  U500 insulin at 150 units bid at home. SSI coverage with resistant scale. CBG monitoring   NEUROLOGIC A:   Acute Encephalopathy - post cardiac arrest  P:   Off sedation Fentanyl  for pain  FAMILY  - Updates: Family updated 5/17 - Inter-disciplinary family meet or Palliative Care meeting done on 5/16.  Chilton Greathouse MD [place] [facility] Pager [phone] If no answer or after 3pm call: [phone] 02/26/2016, 1:47 PM

## 2016-02-26 NOTE — Progress Notes (Signed)
Called ELink and spoke with MD Molli KnockYacoub, MD advised okay to remove patien'ts arterial line.  MD advised if RN able to obtain additional peripheral IV's on patient then it is okay to remove central line.  If RN unable to obtain additional IV access, leave central line in place until 5/18 when rounding MD can review and advise.

## 2016-02-26 NOTE — Consult Note (Signed)
ELECTROPHYSIOLOGY CONSULT NOTE    Patient ID: Ryan Riggs MRN: [id number], DOB/AGE: 05-25-1946 70 y.o.  Admit date: 02/22/2016 Date of Consult: 02/26/2016  Primary Physician: No primary care provider on file. Primary Cardiologist: Annia Friendly ([facility]) Referring MD: Clifton James  Reason for Consultation: VF arrest, ICD shocks   HPI:  Hue Frick is a 70 y.o. male with a past medical history significant for CAD s/p CABG, diabetes, hypertension, ischemic cardiomyopathy (s/p ICD).  He is followed by Dr Annia Friendly in [place] with the [facility] and was in [place] visiting family.  He was at the [facility] when he was found down in the bathroom.  Bystander CPR was started and EMS was called.  On their arrival, he had a pulse but then went into VT and required 3 shocks from AED.  He was given amiodarone and transferred to [facility] for further evaluation. He underwent emergent catheterization which demonstrated no culprit lesions and no targets for revascularization. He was intubated and cooled.  He has since been extubated.    Device interrogation yesterday demonstrated STJ single chamber ICD implanted 2015.  He was programmed with a VF only therapy zone and VT monitor zone.  He had sustained monomorphic VT in the VT monitor zone with a cycle length of that eventually degenerated into VF for which he received HV shock restoring SR.  By device interrogation, episode lasted for at least 5 minutes. Sensing, impedances, thresholds stable. 7.5 years estimated longevity.   He currently states that he is tired and has chest soreness from compressions. He denies recent chest pain, shortness of breath, LE edema, fevers, chills, nausea or vomiting.  He has not had prior ICD therapy.   Echo 02/23/16 demonstrated EF 30-35%, grade 2 diastolic dysfunction, PA pressure 33.   Past Medical History  Diagnosis Date  . Diabetes mellitus without complication (HCC)   . Hypertension   .  Cardiac arrest (HCC) 02/22/2016  . Ventricular fibrillation (HCC) 02/22/2016  . Coronary artery disease   . AICD (automatic cardioverter/defibrillator) present   . Presence of permanent cardiac pacemaker      Surgical History:  Past Surgical History  Procedure Laterality Date  . Coronary artery bypass graft      x4  . Pacemaker insertion    . Insert / replace / remove pacemaker    . Vascular surgery    . Cardiac catheterization    . Cardiac catheterization N/A 02/22/2016    Procedure: Left Heart Cath and Coronary Angiography;  Surgeon: Tonny Bollman, MD;  Location: [facility] INVASIVE CV LAB;  Service: Cardiovascular;  Laterality: N/A;     Prescriptions prior to admission  Medication Sig Dispense Refill Last Dose  . aspirin EC 81 MG tablet Take 81 mg by mouth at bedtime.    02/21/2016  . atorvastatin (LIPITOR) 80 MG tablet Take 80 mg by mouth at bedtime.    02/21/2016  . carvedilol (COREG) 25 MG tablet Take 12.5 mg by mouth 2 (two) times daily with a meal.   02/22/2016 at 1000  . cetirizine (ZYRTEC) 10 MG tablet Take 10 mg by mouth at bedtime.    02/21/2016  . clopidogrel (PLAVIX) 75 MG tablet Take 75 mg by mouth daily.   02/22/2016 at Unknown time  . fenofibrate 160 MG tablet Take 160 mg by mouth daily.   02/22/2016 at Unknown time  . ferrous gluconate (FERGON) 324 MG tablet Take 324 mg by mouth 2 (two) times daily.    02/22/2016  at am  . fluticasone (FLONASE) 50 MCG/ACT nasal spray Place 2 sprays into both nostrils daily.   maybe 5/13  . furosemide (LASIX) 40 MG tablet Take 40 mg by mouth daily.   02/22/2016 at Unknown time  . gabapentin (NEURONTIN) 300 MG capsule Take 300-600 mg by mouth 2 (two) times daily. Take 1 capsule (300 mg) by mouth every morning and 2 capsules (600 mg) at bedtime   02/22/2016 at am  . insulin regular human CONCENTRATED (HUMULIN R U-500 KWIKPEN) 500 UNIT/ML kwikpen Inject 140 Units into the skin 2 (two) times daily. Morning and at bedtime   02/22/2016 at am  . isosorbide  mononitrate (IMDUR) 30 MG 24 hr tablet Take 30 mg by mouth daily.   02/22/2016 at Unknown time  . lisinopril (PRINIVIL,ZESTRIL) 2.5 MG tablet Take 2.5 mg by mouth at bedtime.    02/21/2016  . Omega-3 Fatty Acids (FISH OIL) 1000 MG CAPS Take 2,000 mg by mouth 2 (two) times daily.   02/22/2016 at am  . omeprazole (PRILOSEC) 20 MG capsule Take 20 mg by mouth at bedtime.    02/21/2016  . sertraline (ZOLOFT) 100 MG tablet Take 100 mg by mouth at bedtime.    02/21/2016  . spironolactone (ALDACTONE) 25 MG tablet Take 25 mg by mouth daily.   02/22/2016 at Unknown time  . tamsulosin (FLOMAX) 0.4 MG CAPS capsule Take 0.4 mg by mouth at bedtime.    02/21/2016    Inpatient Medications:  . ampicillin-sulbactam (UNASYN) IV  3 g Intravenous Q6H  . antiseptic oral rinse  7 mL Mouth Rinse BID  . aspirin EC  81 mg Oral Daily  . atorvastatin  80 mg Oral q1800  . insulin aspart  0-20 Units Subcutaneous Q4H  . insulin glargine  30 Units Subcutaneous Q24H  . pantoprazole (PROTONIX) IV  40 mg Intravenous Daily  . sodium chloride flush  10-40 mL Intracatheter Q12H    Allergies:  Allergies  Allergen Reactions  . Ivp Dye [Iodinated Diagnostic Agents] Rash    Social History   Social History  . Marital Status: Unknown    Spouse Name: N/A  . Number of Children: N/A  . Years of Education: N/A   Occupational History  . Not on file.   Social History Main Topics  . Smoking status: Former Games developermoker  . Smokeless tobacco: Not on file  . Alcohol Use: No  . Drug Use: No  . Sexual Activity: Not on file   Other Topics Concern  . Not on file   Social History Narrative     Family History: no premature CAD   Review of Systems: All other systems reviewed and are otherwise negative except as noted above.  Physical Exam: Filed Vitals:   02/26/16 0400 02/26/16 0409 02/26/16 0500 02/26/16 0600  BP: 119/67   115/66  Pulse: 74  84 71  Temp:      TempSrc:      Resp: 16  18 16   Height:      Weight:  191 lb 2.2 oz  (86.7 kg)    SpO2: 98%  99% 98%    GEN- The patient is elderly appearing, alert and oriented x 3 today, fatigued, but awakens to voice HEENT: normocephalic, atraumatic; sclera clear, conjunctiva pink; hearing intact; oropharynx clear; neck supple   Lungs- Clear to ausculation bilaterally, normal work of breathing.  No wheezes, rales, rhonchi Heart- Regular rate and rhythm  GI- soft, non-tender, non-distended, bowel sounds present  Extremities- no clubbing, cyanosis,  or edema  MS- no significant deformity or atrophy Skin- warm and dry, no rash or lesion Psych- euthymic mood, full affect Neuro- strength and sensation are intact  Labs:   Lab Results  Component Value Date   WBC 7.8 02/26/2016   HGB 9.9* 02/26/2016   HCT 30.6* 02/26/2016   MCV 94.4 02/26/2016   PLT 99* 02/26/2016    Recent Labs Lab 02/23/16 0651  02/26/16 0430  NA 138  < > 140  K 4.7  < > 4.2  CL 106  < > 105  CO2 22  < > 25  BUN 37*  < > 31*  CREATININE 1.68*  < > 1.28*  CALCIUM 8.9  < > 8.5*  PROT 6.7  --   --   BILITOT 1.0  --   --   ALKPHOS 34*  --   --   ALT 58  --   --   AST 94*  --   --   GLUCOSE 254*  < > 162*  < > = values in this interval not displayed.    Radiology/Studies: Dg Chest Port 1 View 02/26/2016  CLINICAL DATA:  Acute respiratory failure; MI and CABG 3 days ago EXAM: PORTABLE CHEST 1 VIEW COMPARISON:  Portable chest x-ray of Feb 22, 2016 and Feb 25, 2016 FINDINGS: There has been interval extubation of the trachea and of the esophagus. The lungs are hypoinflated. Confluent alveolar opacity persists in the right mid lung. Small amounts of pleural fluid blunt the costophrenic angles. There is left lower lobe atelectasis. The cardiac silhouette is enlarged. The pulmonary vascularity is mildly engorged. The sternal wires are intact. The pacemaker -defibrillator is in stable position. The left internal jugular venous catheter tip projects over the proximal SVC. IMPRESSION: CHF with mild pulmonary  interstitial edema. Confluent right perihilar opacity, stable, likely reflecting pneumonia. Bibasilar atelectasis and small effusions. The remaining support tubes and devices are in reasonable position. Electronically Signed   By: David  [place]and M.D.   On: 02/26/2016 07:22   ZOX:WRUEA rhythm, RBBB, rate 85  TELEMETRY: sinus rhythm  DEVICE HISTORY: STJ Fortify Assura single chamber ICD implanted 2015 for primary prevention   Assessment/Plan: 1.  VT/VF arrest The patient had VF arrest preceded by sustained VT ( ) in monitor zone not treated by ICD We have reprogrammed device to 3 zones with VT zone at 171bpm (may need to be lowered further, will monitor for recurrent VT while in hospital) Agree with amiodarone, would continue IV another 24 hours then convert to po (400mg  bid x2 weeks then 400mg  daily for 2 weeks then 200mg  daily) Keep K >3.9, Mg >1.8 No driving x 6 months (family aware)  2.  ICD on manufacturer advisory The patient's STJ ICD is on manufacturer advisory I have not discussed this with patient today as he is still very lethargic Will review tomorrow Will defer decision to change out/following conservatively to primary EP (I have left message for them to call me today)  3.  CAD s/p CABG Cath this admission with no targets for revascularization Continue medical therapy  4.  Acute on chronic systolic heart failure IV Lasix added by Dr Clifton James today Continue BB Resume ACE-I when able    Signed, Gypsy Balsam, NP 02/26/2016 8:18 AM   I have seen and examined this patient with Gypsy Balsam.  Agree with above, note added to reflect my findings.  On exam, regular rhythm, no murmurs, lungs clear.  Had VT arrest while in the bathroom  requiring CPR.  Eventually went into VF and was shocked by his ICD.  Currently on amiodarone.    VT was below detection zone set previously.  Due to that, have decreased his detection zone.  Would continue amiodarone.  His ICD is also under  advisory, so have contacted his primary cardiologist to discuss further options of possibly generator change.  Will M. Camnitz MD 02/26/2016 12:30 PM

## 2016-02-26 NOTE — Progress Notes (Signed)
Pt sitting in chair in no signs of distress at this time.

## 2016-02-26 NOTE — Progress Notes (Signed)
Inpatient Diabetes Program Recommendations  AACE/ADA: New Consensus Statement on Inpatient Glycemic Control (2015)  Target Ranges:  Prepandial:   less than 140 mg/dL      Peak postprandial:   less than 180 mg/dL (1-2 hours)      Critically ill patients:  140 - 180 mg/dL   Review of Glycemic Control  Diabetes history: DM 2 Outpatient Diabetes medications: U 500 140 units bid Current orders for Inpatient glycemic control: Received total of Lantus 100 units today with transition off of insulin drip + Novolog resistant 0-20 units q 4 hrs.  Inpatient Diabetes Program Recommendations:  Noted patient transitioning from IV insulin drip to Lantus. If Lantus dose is to be continued, please consider Lantus 50 units bid or 50% home dose of U 500. Will follow.  Thank you, Billy FischerJudy E. Ebenezer Mccaskey, RN, MSN, CDE Inpatient Glycemic Control Team Team Pager [phone] (8am-5pm) 02/26/2016 10:54 AM

## 2016-02-26 NOTE — Progress Notes (Signed)
eLink Physician-Brief Progress Note Patient Name: Ryan Riggs DOB: 04/22/1946 MRN: [id number]   Date of Service  02/26/2016  HPI/Events of Note  Hiccups  eICU Interventions  Compazine x1     Intervention Category Major Interventions: Other:  YACOUB,WESAM 02/26/2016, 10:01 PM

## 2016-02-26 NOTE — Progress Notes (Signed)
     SUBJECTIVE: Pt awake. Chest wall is sore  Tele: sinus  BP 135/110 mmHg  Pulse 85  Temp(Src) 97.4 F (36.3 C) (Oral)  Resp 22  Ht 5\' 10"  (1.778 m)  Wt 191 lb 2.2 oz (86.7 kg)  BMI 27.43 kg/m2  SpO2 96%  Intake/Output Summary (Last 24 hours) at 02/26/16 16100832 Last data filed at 02/26/16 0800  Gross per 24 hour  Intake 1301.19 ml  Output   4165 ml  Net -2863.81 ml    PHYSICAL EXAM General: Well developed, well nourished, in no acute distress. Alert and oriented x 3.  Psych:  Good affect, responds appropriately Neck: No JVD. No masses noted.  Lungs: Crackles bilaterally with no wheezes or rhonci noted.  Heart: RRR with no murmurs noted. Abdomen: Bowel sounds are present. Soft, non-tender.  Extremities: No lower extremity edema.   LABS: Basic Metabolic Panel:  Recent Labs  96/01/5404/16/17 0400 02/26/16 0430  NA 137 140  K 4.6 4.2  CL 105 105  CO2 22 25  GLUCOSE 295* 162*  BUN 33* 31*  CREATININE 1.55* 1.28*  CALCIUM 8.0* 8.5*  MG 1.7 1.8  PHOS 2.1* 1.9*   CBC:  Recent Labs  02/25/16 0400 02/26/16 0430  WBC 11.9* 7.8  HGB 10.4* 9.9*  HCT 31.6* 30.6*  MCV 95.2 94.4  PLT 103* 99*   Cardiac Enzymes:  Recent Labs  02/23/16 1515  TROPONINI 0.35*   Fasting Lipid Panel:  Recent Labs  02/25/16 0055  TRIG 280*    Current Meds: . ampicillin-sulbactam (UNASYN) IV  3 g Intravenous Q6H  . antiseptic oral rinse  7 mL Mouth Rinse BID  . aspirin EC  81 mg Oral Daily  . atorvastatin  80 mg Oral q1800  . insulin aspart  0-20 Units Subcutaneous Q4H  . insulin glargine  30 Units Subcutaneous Q24H  . pantoprazole (PROTONIX) IV  40 mg Intravenous Daily  . sodium chloride flush  10-40 mL Intracatheter Q12H     ASSESSMENT AND PLAN: 70 yo with history of CABG and DM was admitted after cardiac arrest/ICD discharge. Cath showed likely stable anatomy, no intervention was performed.   1. CAD s/p CABG: Cardiac cath 02/22/16 with no clear culprit for ACS.  (occluded native vessels, chronic occlusion SVG-RCA, patent LIMA-LAD and SVG-OM). Continue ASA 81, statin. Restart beta blocker today.   2. Cardiac arrest: Likely primary arrhythmic event related to scar/ischemic cardiomyopathy. ICD interrogation with appropriate discharges for ventricular fibrillation. IV amiodarone started 02/25/16 given event as well as PVCs. Will ask EP to see today.   3. Ischemic cardiomyopathy: LVEF=30-35% by echo. This is chronic. Beta blocker and Ace-inh on hold for now. Will restart Coreg today at lower dosage than home dosage. Will restart Lisinopril as BP allows.   4. Acute on chronic systolic CHF: Pt is volume overloaded. Will start IV Lasix today.        Verne CarrowChristopher McAlhany  5/17/20178:32 AM

## 2016-02-27 ENCOUNTER — Inpatient Hospital Stay (HOSPITAL_COMMUNITY): Payer: Medicare HMO

## 2016-02-27 DIAGNOSIS — Z794 Long term (current) use of insulin: Secondary | ICD-10-CM

## 2016-02-27 DIAGNOSIS — N183 Chronic kidney disease, stage 3 unspecified: Secondary | ICD-10-CM | POA: Insufficient documentation

## 2016-02-27 DIAGNOSIS — E1122 Type 2 diabetes mellitus with diabetic chronic kidney disease: Secondary | ICD-10-CM | POA: Insufficient documentation

## 2016-02-27 LAB — CBC
HEMATOCRIT: 32.9 % — AB (ref 39.0–52.0)
HEMOGLOBIN: 10.7 g/dL — AB (ref 13.0–17.0)
MCH: 30.2 pg (ref 26.0–34.0)
MCHC: 32.5 g/dL (ref 30.0–36.0)
MCV: 92.9 fL (ref 78.0–100.0)
Platelets: 129 10*3/uL — ABNORMAL LOW (ref 150–400)
RBC: 3.54 MIL/uL — AB (ref 4.22–5.81)
RDW: 14.9 % (ref 11.5–15.5)
WBC: 7.3 10*3/uL (ref 4.0–10.5)

## 2016-02-27 LAB — BASIC METABOLIC PANEL
ANION GAP: 12 (ref 5–15)
BUN: 34 mg/dL — ABNORMAL HIGH (ref 6–20)
CHLORIDE: 100 mmol/L — AB (ref 101–111)
CO2: 25 mmol/L (ref 22–32)
Calcium: 8.8 mg/dL — ABNORMAL LOW (ref 8.9–10.3)
Creatinine, Ser: 1.44 mg/dL — ABNORMAL HIGH (ref 0.61–1.24)
GFR calc Af Amer: 56 mL/min — ABNORMAL LOW (ref >60)
GFR, EST NON AFRICAN AMERICAN: 48 mL/min — AB (ref >60)
Glucose, Bld: 204 mg/dL — ABNORMAL HIGH (ref 65–99)
POTASSIUM: 3.9 mmol/L (ref 3.5–5.1)
SODIUM: 137 mmol/L (ref 135–145)

## 2016-02-27 LAB — GLUCOSE, CAPILLARY
GLUCOSE-CAPILLARY: 197 mg/dL — AB (ref 65–99)
GLUCOSE-CAPILLARY: 276 mg/dL — AB (ref 65–99)
GLUCOSE-CAPILLARY: 246 mg/dL — AB (ref 65–99)
Glucose-Capillary: 202 mg/dL — ABNORMAL HIGH (ref 65–99)
Glucose-Capillary: 202 mg/dL — ABNORMAL HIGH (ref 65–99)
Glucose-Capillary: 211 mg/dL — ABNORMAL HIGH (ref 65–99)

## 2016-02-27 LAB — PROCALCITONIN: PROCALCITONIN: 0.59 ng/mL

## 2016-02-27 LAB — PHOSPHORUS: PHOSPHORUS: 2.7 mg/dL (ref 2.5–4.6)

## 2016-02-27 LAB — MAGNESIUM: Magnesium: 1.5 mg/dL — ABNORMAL LOW (ref 1.7–2.4)

## 2016-02-27 MED ORDER — AMIODARONE HCL 200 MG PO TABS
400.0000 mg | ORAL_TABLET | Freq: Two times a day (BID) | ORAL | Status: DC
  Administered 2016-02-27 – 2016-03-02 (×9): 400 mg via ORAL
  Filled 2016-02-27 (×9): qty 2

## 2016-02-27 MED ORDER — PROCHLORPERAZINE EDISYLATE 5 MG/ML IJ SOLN
5.0000 mg | Freq: Four times a day (QID) | INTRAMUSCULAR | Status: DC | PRN
  Administered 2016-02-27 – 2016-02-28 (×3): 5 mg via INTRAVENOUS
  Filled 2016-02-27 (×5): qty 1

## 2016-02-27 MED ORDER — PANTOPRAZOLE SODIUM 40 MG PO TBEC
40.0000 mg | DELAYED_RELEASE_TABLET | Freq: Every day | ORAL | Status: DC
  Administered 2016-02-28 – 2016-03-02 (×4): 40 mg via ORAL
  Filled 2016-02-27 (×4): qty 1

## 2016-02-27 MED ORDER — INSULIN ASPART 100 UNIT/ML ~~LOC~~ SOLN
0.0000 [IU] | Freq: Three times a day (TID) | SUBCUTANEOUS | Status: DC
  Administered 2016-02-28 (×2): 3 [IU] via SUBCUTANEOUS
  Administered 2016-02-29: 7 [IU] via SUBCUTANEOUS
  Administered 2016-03-01: 3 [IU] via SUBCUTANEOUS
  Administered 2016-03-01 – 2016-03-02 (×2): 4 [IU] via SUBCUTANEOUS
  Administered 2016-03-02: 7 [IU] via SUBCUTANEOUS

## 2016-02-27 MED ORDER — BOOST / RESOURCE BREEZE PO LIQD
1.0000 | Freq: Three times a day (TID) | ORAL | Status: DC
  Administered 2016-02-27 – 2016-03-02 (×9): 1 via ORAL

## 2016-02-27 MED ORDER — HYDROCODONE-ACETAMINOPHEN 5-325 MG PO TABS
1.0000 | ORAL_TABLET | Freq: Four times a day (QID) | ORAL | Status: DC | PRN
  Administered 2016-02-27 – 2016-03-01 (×9): 1 via ORAL
  Filled 2016-02-27 (×9): qty 1

## 2016-02-27 MED ORDER — INSULIN GLARGINE 100 UNIT/ML ~~LOC~~ SOLN
50.0000 [IU] | Freq: Two times a day (BID) | SUBCUTANEOUS | Status: DC
  Administered 2016-02-27: 50 [IU] via SUBCUTANEOUS
  Filled 2016-02-27 (×2): qty 0.5

## 2016-02-27 MED ORDER — MAGNESIUM SULFATE 2 GM/50ML IV SOLN
2.0000 g | Freq: Once | INTRAVENOUS | Status: AC
  Administered 2016-02-27: 2 g via INTRAVENOUS
  Filled 2016-02-27: qty 50

## 2016-02-27 MED ORDER — INSULIN GLARGINE 100 UNIT/ML ~~LOC~~ SOLN
75.0000 [IU] | Freq: Two times a day (BID) | SUBCUTANEOUS | Status: DC
  Administered 2016-02-28 – 2016-03-01 (×7): 75 [IU] via SUBCUTANEOUS
  Filled 2016-02-27 (×10): qty 0.75

## 2016-02-27 MED ORDER — INSULIN ASPART 100 UNIT/ML ~~LOC~~ SOLN: 0.0000 [IU] | Freq: Every day | SUBCUTANEOUS | Status: DC

## 2016-02-27 NOTE — Progress Notes (Signed)
PROGRESS NOTE    Ryan HamperJoseph Riggs  ZOX:[id number] DOB: 03/07/1946 DOA: 02/22/2016 PCP: No primary care provider on file.   Outpatient Specialists:     Brief Narrative:  70 y/o M with PMH of DM II, CAD s/p CABG, HTN and pacemaker insertion who [facility] after VF arrest. He [facility] been [facility]. Other medical history unknown.   The patient was reportedly at a barn dance and went to the restroom. He was later found down by bystanders. His son performed CPR. He had been feeling poorly for approximately 2 weeks. EMS activated with initial rhythm of VT. He was shocked 2-3 times by his ICD. EMS intubated. He was treated with amiodarone and transported to ER for evaluation. Initial EKG demonstrated ST changes. To cath lab with prior graft disease identified, no interventions. Returned to ICU for hypothermia protocol. [facility] and [facility]     Assessment & Plan:   Active Problems:   Cardiac arrest [facility](HCC)   Ventricular fibrillation (HCC)   Respiratory failure (HCC)   Acute respiratory failure (HCC)   Abnormal EEG   Acute Respiratory Failure - in setting of cardiac arrest with possible aspiration -extubated -unasyn for aspiration PNA  DM-2 uncontrolled -on U500 at home--- PCCM giving 100 units daily  -SSI  Acute on chronic systolic CHF - volume overloaded.  -IV Lasix started 02/26/16- lasix per cards   Ischemic cardiomyopathy: LVEF=30-35% by echo. This is chronic. Coreg has been restarted. Will restart Ace-inh before discharge if renal function remains stable.   Dysphagia -SLP following -NPO since 5/13-- for MBS 5/18-- DYS 2 diet -[facility] [place]  VT arrest with CPR in field -AICD shock -EP following -amio per cards  NSTEMI Continue ASA 81, statin and beta blocker  Rib pain -PRN pain meds  AKI -[facility] -baseline unknown -[facility] the  [facility]    DVT prophylaxis:  SCD's  Code Status: Full Code   Family Communication: Son at bedside  Disposition Plan:     Consultants:   PCCM  EP  Cards  Procedures:  LHC 5/13 showing occluded native vessels, chronic occlusion SVG-RCA, patent LIMA-LAD and SVG-OM. No clear culprit lesion EEG 5/13 >>  ECHO 5/13 >> LVEF 30-35%, grade 2 diastolic dysfunction     Subjective: Having rib pain  Objective: Filed Vitals:   02/27/16 0800 02/27/16 0814 02/27/16 1134 02/27/16 1200  BP: 133/73 128/63 126/67 105/82  Pulse: 73 63 61 62  Temp:  97.7 F (36.5 C) 97.5 F (36.4 C)   TempSrc:  Oral Oral   Resp: 14 15 19 16   Height:      Weight:      SpO2: 93% 95% 94% 94%    Intake/Output Summary (Last 24 hours) at 02/27/16 1335 Last data filed at 02/27/16 1208  Gross per 24 hour  Intake  900.7 ml  Output   3110 ml  Net -2209.3 ml   Filed Weights   02/25/16 0600 02/26/16 0409 02/27/16 0340  Weight: 88.86 kg (195 lb 14.4 oz) 86.7 kg (191 lb 2.2 oz) 77.111 kg (170 lb)    Examination:  General exam: sick appearing, mild distress Respiratory system: Clear to auscultation. Respiratory effort normal. Cardiovascular system: S1 & S2 heard, RRR. No JVD, murmurs, rubs, gallops or clicks. No pedal edema. Gastrointestinal system: Abdomen is nondistended, soft and nontender. No organomegaly or masses felt. Normal bowel sounds heard.  Central nervous system: Alert and oriented. No focal neurological deficits. Extremities: Symmetric 5 x 5 power.     Data Reviewed: I have personally reviewed following labs and imaging studies  CBC:  Recent Labs Lab 02/23/16 0651  02/24/16 0400 02/24/16 0404 02/25/16 0400 02/26/16 0430 02/27/16 0346  WBC 17.9*  --  18.8*  --  11.9* 7.8 7.3  HGB 11.1*  < > 11.9* 13.6 10.4* 9.9* 10.7*  HCT 34.8*  < > 36.4* 40.0 31.6* 30.6* 32.9*  MCV 95.1  --  93.8  --  95.2 94.4 92.9  PLT 217  --  206  --  103* 99* 129*  < > = values in this interval  not displayed. Basic Metabolic Panel:  Recent Labs Lab 02/23/16 0651  02/24/16 0800 02/24/16 1214 02/25/16 0400 02/26/16 0430 02/27/16 0346  NA 138  < > 136 135 137 140 137  K 4.7  < > 4.8 5.1 4.6 4.2 3.9  CL 106  < > 105 104 105 105 100*  CO2 22  < > 21* 18* GLUCOSE 254*  < > 223* 242* 295* 162* 204*  BUN 37*  < > 29* 30* 33* 31* 34*  CREATININE 1.68*  < > 1.36* 1.74* 1.55* 1.28* 1.44*  CALCIUM 8.9  < > 8.3* 8.3* 8.0* 8.5* 8.8*  MG 2.0  --   --   --  1.7 1.8 1.5*  PHOS 3.9  --   --   --  2.1* 1.9* 2.7  < > = values in this interval not displayed. GFR: Estimated Creatinine Clearance: 50.7 mL/min (by C-G formula based on Cr of 1.44). Liver Function Tests:  Recent Labs Lab 02/23/16 0651  AST 94*  ALT 58  ALKPHOS 34*  BILITOT 1.0  PROT 6.7  ALBUMIN 3.5   No results for input(s): LIPASE, AMYLASE in the last 168 hours. No results for input(s): AMMONIA in the last 168 hours. Coagulation Profile:  Recent Labs Lab 02/22/16 2230 02/23/16 0652  INR 1.31 1.30   Cardiac Enzymes:  Recent Labs Lab 02/23/16 1515  TROPONINI 0.35*   BNP (last 3 results) No results for input(s): PROBNP in the last 8760 hours. HbA1C: No results for input(s): HGBA1C in the last 72 hours. CBG:  Recent Labs Lab 02/26/16 1926 02/27/16 0009 02/27/16 0347 02/27/16 0816 02/27/16 1135  GLUCAP 197* 202* 211* 197* 276*   Lipid Profile:  Recent Labs  02/25/16 0055  TRIG 280*   Thyroid Function Tests: No results for input(s): TSH, T4TOTAL, FREET4, T3FREE, THYROIDAB in the last 72 hours. Anemia Panel: No results for input(s): VITAMINB12, FOLATE, FERRITIN, TIBC, IRON, RETICCTPCT in the last 72 hours. Urine analysis: No results found for: COLORURINE, APPEARANCEUR, LABSPEC, PHURINE, GLUCOSEU, HGBUR, BILIRUBINUR, KETONESUR, PROTEINUR, UROBILINOGEN, NITRITE, LEUKOCYTESUR   Recent Results (from the past 240 hour(s))  MRSA PCR Screening     Status: None   Collection Time:  02/23/16  3:00 AM  Result Value Ref Range Status   MRSA by PCR NEGATIVE NEGATIVE Final    Comment:        The GeneXpert MRSA Assay (FDA approved for NASAL specimens only), is one component of a comprehensive MRSA colonization surveillance program. It is not intended to diagnose MRSA infection nor to guide or monitor treatment for MRSA infections.       Anti-infectives    Start     Dose/Rate Route Frequency Ordered Stop   02/24/16 0645  Ampicillin-Sulbactam (UNASYN) 3 g in sodium chloride 0.9 %  100 mL IVPB    Comments:  Unasyn 3 g IV q6h for CrCL > 30 mL/min   3 g 100 mL/hr over 60 Minutes Intravenous Every 6 hours 02/24/16 0626         Radiology Studies: Dg Chest Port 1 View  02/27/2016  CLINICAL DATA:  Acute respiratory failure, cardiac arrest EXAM: PORTABLE CHEST 1 VIEW COMPARISON:  Portable chest x-ray of Feb 26, 2016 FINDINGS: There has been interval removal of the left internal jugular venous catheter. The lungs are mildly hypoinflated. The interstitial markings have improved. There is no pneumothorax or significant pleural effusion. Subsegmental atelectasis at the left lung base persists. Inferior right upper lobe density has improved significantly. The cardiac silhouette remains enlarged. The pulmonary vascularity is less engorged. The permanent pacemaker defibrillator is in stable position. IMPRESSION: Improved appearance of the pulmonary interstitium consistent with resolving interstitial edema and/or interstitial pneumonia. Stable cardiomegaly with no more than mild central pulmonary vascular prominence. Electronically Signed   By: David  [place]and M.D.   On: 02/27/2016 07:28   Dg Chest Port 1 View  02/26/2016  CLINICAL DATA:  Acute respiratory failure; MI and CABG 3 days ago EXAM: PORTABLE CHEST 1 VIEW COMPARISON:  Portable chest x-ray of Feb 22, 2016 and Feb 25, 2016 FINDINGS: There has been interval extubation of the trachea and of the esophagus. The lungs are hypoinflated.  Confluent alveolar opacity persists in the right mid lung. Small amounts of pleural fluid blunt the costophrenic angles. There is left lower lobe atelectasis. The cardiac silhouette is enlarged. The pulmonary vascularity is mildly engorged. The sternal wires are intact. The pacemaker -defibrillator is in stable position. The left internal jugular venous catheter tip projects over the proximal SVC. IMPRESSION: CHF with mild pulmonary interstitial edema. Confluent right perihilar opacity, stable, likely reflecting pneumonia. Bibasilar atelectasis and small effusions. The remaining support tubes and devices are in reasonable position. Electronically Signed   By: David  [place] M.D.   On: 02/26/2016 07:22        Scheduled Meds: . amiodarone  400 mg Oral BID  . ampicillin-sulbactam (UNASYN) IV  3 g Intravenous Q6H  . antiseptic oral rinse  7 mL Mouth Rinse BID  . aspirin EC  81 mg Oral Daily  . atorvastatin  80 mg Oral q1800  . carvedilol  3.125 mg Oral BID WC  . furosemide  40 mg Intravenous Q12H  . insulin aspart  0-20 Units Subcutaneous Q4H  . insulin glargine  50 Units Subcutaneous BID  . pantoprazole (PROTONIX) IV  40 mg Intravenous Daily  . sodium chloride flush  10-40 mL Intracatheter Q12H   Continuous Infusions: . sodium chloride Stopped (02/27/16 0800)     LOS: 5 days    Time spent: 25 min    Danyal Adorno Juanetta Gosling, DO [facility] Pager [phone]  If 7PM-7AM, please contact night-coverage [email or web address] Password [email or web address] 02/27/2016, 1:35 PM

## 2016-02-27 NOTE — Progress Notes (Signed)
     SUBJECTIVE: Chest wall sore. No other complaints.   Tele: sinus  BP 119/58 mmHg  Pulse 65  Temp(Src) 98.5 F (36.9 C) (Oral)  Resp 10  Ht 5\' 10"  (1.778 m)  Wt 170 lb (77.111 kg)  BMI 24.39 kg/m2  SpO2 88%  Intake/Output Summary (Last 24 hours) at 02/27/16 0729 Last data filed at 02/27/16 0548  Gross per 24 hour  Intake 946.24 ml  Output   3745 ml  Net -2798.76 ml    PHYSICAL EXAM General: Well developed, well nourished, in no acute distress. Alert and oriented x 3.  Psych:  Good affect, responds appropriately Neck: No JVD. No masses noted.  Lungs: Bibasilar crackles. No wheezes or rhonci noted.  Heart: RRR with no murmurs noted. Abdomen: Bowel sounds are present. Soft, non-tender.  Extremities: No lower extremity edema.   LABS: Basic Metabolic Panel:  Recent Labs  16/07/9604/17/17 0430 02/27/16 0346  NA 140 137  K 4.2 3.9  CL 105 100*  CO2 25 25  GLUCOSE 162* 204*  BUN 31* 34*  CREATININE 1.28* 1.44*  CALCIUM 8.5* 8.8*  MG 1.8 1.5*  PHOS 1.9* 2.7   CBC:  Recent Labs  02/26/16 0430 02/27/16 0346  WBC 7.8 7.3  HGB 9.9* 10.7*  HCT 30.6* 32.9*  MCV 94.4 92.9  PLT 99* 129*   Fasting Lipid Panel:  Recent Labs  02/25/16 0055  TRIG 280*    Current Meds: . ampicillin-sulbactam (UNASYN) IV  3 g Intravenous Q6H  . antiseptic oral rinse  7 mL Mouth Rinse BID  . aspirin EC  81 mg Oral Daily  . atorvastatin  80 mg Oral q1800  . carvedilol  3.125 mg Oral BID WC  . furosemide  40 mg Intravenous Q12H  . insulin aspart  0-20 Units Subcutaneous Q4H  . insulin glargine  100 Units Subcutaneous Daily  . pantoprazole (PROTONIX) IV  40 mg Intravenous Daily  . sodium chloride flush  10-40 mL Intracatheter Q12H     ASSESSMENT AND PLAN: 70 yo with history of CABG and DM was admitted after cardiac arrest/ICD discharge. Cath showed likely stable anatomy, no intervention was performed.   1. CAD s/p CABG: Cardiac cath 02/22/16 with no clear culprit for ACS.  (occluded native vessels, chronic occlusion SVG-RCA, patent LIMA-LAD and SVG-OM). Continue ASA 81, statin and beta blocker.    2. Cardiac arrest: VT/VF noted on ICD interrogation. Our EP team is now following and has adjusted his device settings. Will finish IV amiodarone loading today and switch to po amiodarone (400 mg po BID for 2 weeks, 400 mg po daily for 2 weeks then 200 mg daily).    3. Ischemic cardiomyopathy: LVEF=30-35% by echo. This is chronic. Coreg has been restarted. Will restart Ace-inh before discharge if renal function remains stable.    4. Acute on chronic systolic CHF: Pt is volume overloaded. IV Lasix started 02/26/16. Good diuresis with 2.8 liters negative last 24 hours. Will continue IV Lasix today.      Ryan Riggs  5/18/20177:29 AM

## 2016-02-27 NOTE — Progress Notes (Signed)
SUBJECTIVE: The patient is doing well today.  At this time, he denies chest pain, shortness of breath, or any new concerns. Still a little residual encephalopathy  CURRENT MEDICATIONS: . amiodarone  400 mg Oral BID  . ampicillin-sulbactam (UNASYN) IV  3 g Intravenous Q6H  . antiseptic oral rinse  7 mL Mouth Rinse BID  . aspirin EC  81 mg Oral Daily  . atorvastatin  80 mg Oral q1800  . carvedilol  3.125 mg Oral BID WC  . furosemide  40 mg Intravenous Q12H  . insulin aspart  0-20 Units Subcutaneous Q4H  . insulin glargine  50 Units Subcutaneous BID  . pantoprazole (PROTONIX) IV  40 mg Intravenous Daily  . sodium chloride flush  10-40 mL Intracatheter Q12H   . sodium chloride 10 mL/hr at 02/27/16 0548    OBJECTIVE: Physical Exam: Filed Vitals:   02/27/16 0400 02/27/16 0500 02/27/16 0600 02/27/16 0814  BP: 133/63 105/53 119/58 128/63  Pulse: 78 70 65 63  Temp:    97.7 F (36.5 C)  TempSrc:    Oral  Resp: Height:      Weight:      SpO2: 91% 90% 88% 95%    Intake/Output Summary (Last 24 hours) at 02/27/16 1031 Last data filed at 02/27/16 0548  Gross per 24 hour  Intake  850.3 ml  Output   3165 ml  Net -2314.7 ml    Telemetry reveals sinus rhythm  GEN- The patient is chronically ill appearing, alert and oriented x 3 today.   Head- normocephalic, atraumatic Eyes-  Sclera clear, conjunctiva pink Ears- hearing intact Oropharynx- clear Neck- supple  Lungs- Clear to ausculation bilaterally, normal work of breathing Heart- Regular rate and rhythm  GI- soft, NT, ND, + BS Extremities- no clubbing, cyanosis, or edema Skin- no rash or lesion Psych- euthymic mood, full affect Neuro- strength and sensation are intact  LABS: Basic Metabolic Panel:  Recent Labs  16/10/96 0430 02/27/16 0346  NA 140 137  K 4.2 3.9  CL 105 100*  CO2 25 25  GLUCOSE 162* 204*  BUN 31* 34*  CREATININE 1.28* 1.44*  CALCIUM 8.5* 8.8*  MG 1.8 1.5*  PHOS 1.9* 2.7    CBC:  Recent Labs  02/26/16 0430 02/27/16 0346  WBC 7.8 7.3  HGB 9.9* 10.7*  HCT 30.6* 32.9*  MCV 94.4 92.9  PLT 99* 129*   Fasting Lipid Panel:  Recent Labs  02/25/16 0055  TRIG 280*    RADIOLOGY: Dg Chest Port 1 View 02/27/2016  CLINICAL DATA:  Acute respiratory failure, cardiac arrest EXAM: PORTABLE CHEST 1 VIEW COMPARISON:  Portable chest x-ray of Feb 26, 2016 FINDINGS: There has been interval removal of the left internal jugular venous catheter. The lungs are mildly hypoinflated. The interstitial markings have improved. There is no pneumothorax or significant pleural effusion. Subsegmental atelectasis at the left lung base persists. Inferior right upper lobe density has improved significantly. The cardiac silhouette remains enlarged. The pulmonary vascularity is less engorged. The permanent pacemaker defibrillator is in stable position. IMPRESSION: Improved appearance of the pulmonary interstitium consistent with resolving interstitial edema and/or interstitial pneumonia. Stable cardiomegaly with no more than mild central pulmonary vascular prominence. Electronically Signed   By: David  [place] M.D.   On: 02/27/2016 07:28     ASSESSMENT AND PLAN:  Active Problems:   Cardiac arrest [facility])   Ventricular fibrillation (HCC)   Respiratory failure (HCC)   Acute respiratory failure (HCC)  Abnormal EEG   1. VT/VF arrest The patient had VF arrest preceded by sustained VT (320msec) in monitor zone not treated by ICD We have reprogrammed device to 3 zones with VT zone at 171bpm  Continue amiodarone 400mg  bid x2 weeks then 400mg  daily for 2 weeks then 200mg  daily Keep K >3.9, Mg >1.8 No driving x 6 months (family aware)  2. ICD on manufacturer advisory The patient's STJ ICD is on manufacturer advisory Discussed with patient and family today.   Will defer decision to change out/following conservatively to primary EP (I have left message for them to call me today)  3. CAD s/p  CABG Cath this admission with no targets for revascularization Continue medical therapy  4. Acute on chronic systolic heart failure IV Lasix added by Dr Clifton JamesMcAlhany today Continue BB Resume ACE-I when able   Gypsy BalsamAmber Seiler, NP 02/27/2016 10:33 AM  EP Attending  Patient seen and examined. I have modified the exam and assessment and plan minimally. He is improving but still has a little residual encephalopathy.  A/P 1. VT below the device detection, sustained and degenerating to VF after 6-7 minutes. He is tolerating amio. He device detection has been lowered but still may need additional adjustment.  2. ICD - he has a recall device and might need to consider device replacement. We will defer this to his primary EP MD in [place]. 3. Encephalopathy - he is pending barium swallow. He is slowly improving.  Leonia ReevesGregg Hephzibah Strehle,M.D.

## 2016-02-27 NOTE — Progress Notes (Signed)
Speech Language Pathology Treatment: Dysphagia  Patient Details Name: Baron HamperJoseph Shutter MRN: [id number] DOB: 12/07/1945 Today's Date: 02/27/2016 Time: [id number] SLP Time Calculation (min) (ACUTE ONLY): 17 min  Assessment / Plan / Recommendation Clinical Impression  F/u for ongoing dysphagia management: pt with persisting s/s of aspiration with consumption of thin liquids.  Explosive coughing elicited after several boluses.  Purees are tolerated better, however, multiple subswallows suggest residue post-swallow. Educated pt/son re: potential transient impact of intubation upon swallow function.  Recommend proceeding with MBS today for definitive dx and safest PO recommendation.  Scheduled for 1:00; pt/RN aware.    HPI HPI: 70 y/o M with PMH of DM II, CAD s/p CABG, HTN and pacemaker insertion who [facility] after VF arrest. Large volume emesis with concern for aspiration 5/13. ETT 5/13-5/16.       SLP Plan  Continue with current plan of care;MBS     Recommendations  Diet recommendations: NPO (except meds whole in puree) Medication Administration: Whole meds with puree             Oral Care Recommendations: Oral care QID Plan: Continue with current plan of care;MBS     GO              Johari Bennetts L. Samson Fredericouture, [place] CCC/SLP Pager [phone]   Blenda MountsCouture, Ariyah Sedlack Laurice 02/27/2016, 10:21 AM

## 2016-02-27 NOTE — Progress Notes (Signed)
Speech Pathology:  MBSS complete. Full report located under chart review in imaging section. Start Dysphagia 2, CHIN TUCK with thin liquids to prevent aspiration.  Tee Richeson L. Samson Fredericouture, [place] CCC/SLP Pager [phone]

## 2016-02-27 NOTE — Progress Notes (Signed)
Nutrition Follow-up  DOCUMENTATION CODES:   Not applicable  INTERVENTION:   -Boost Breeze po TID, each supplement provides 250 kcal and 9 grams of protein  NUTRITION DIAGNOSIS:   Inadequate oral intake related to poor appetite as evidenced by per patient/family report.  Ongoing  GOAL:   Patient will meet greater than or equal to 90% of their needs  Unmet  MONITOR:   PO intake, Supplement acceptance, Labs, Weight trends, Skin, I & O's  REASON FOR ASSESSMENT:   Ventilator    ASSESSMENT:   Pt with history of DM, CAD and prior CABG/AICD admitted 5/13 post VT arrest. Admitted to ICU for hypothermia protocol. He had known CAD and has an ICD in place. Left heart cath showed severe stenosis of all native coronary vessels  Pt extubated on 02/25/16.   Pt underwent BSE on 02/26/16; pt with high aspiration risk after failing 3 oz water test per SLP note. SLP recommending continue NPO status. Pt underwent MBSS just prior to RD visit; pt has now advanced to a dysphagia 2 diet with thin liquids.   Spoke with pt at bedside. He reports he has been consuming some water and diet Pepsi since returning from MBSS. He reveals that he feels comfortable drinking thin liquids, however, overall does not feel very hungry. Offered supplements and pt amenable to [employer] Hann[employer]in[employer]ost Breeze supplement (pt really enjoys juice).   Discussed importance of good meal and supplement intake to promote healing.   Lunch tray arrived at conclusion of RD visit.   Labs reviewed: CBGS: 197-211, Mg: 1.5.   Diet Order:  DIET DYS 2 Room service appropriate?: Yes; Fluid consistency:: Thin  Skin:  Reviewed, no issues  Last BM:  02/27/16  Height:   Ht Readings from Last 1 Encounters:  02/22/16 5\' 10"  (1.778 m)    Weight:   Wt Readings from Last 1 Encounters:  02/27/16 170 lb (77.111 kg)    Ideal Body Weight:  75.5 kg  BMI:  Body mass index is 24.39 kg/(m^2).  Estimated Nutritional Needs:   Kcal:   1900-2100  Protein:  100-115 grams  Fluid:  1.9-2.1 L  EDUCATION NEEDS:   Education needs addressed  Rayhaan Huster A. Mayford KnifeWilliams, RD, LDN, CDE Pager: [phone] After hours Pager: [phone]

## 2016-02-28 LAB — BASIC METABOLIC PANEL
Anion gap: 11 (ref 5–15)
BUN: 35 mg/dL — ABNORMAL HIGH (ref 6–20)
CHLORIDE: 97 mmol/L — AB (ref 101–111)
CO2: 29 mmol/L (ref 22–32)
CREATININE: 1.46 mg/dL — AB (ref 0.61–1.24)
Calcium: 9 mg/dL (ref 8.9–10.3)
GFR calc non Af Amer: 48 mL/min — ABNORMAL LOW (ref >60)
GFR, EST AFRICAN AMERICAN: 55 mL/min — AB (ref >60)
GLUCOSE: 122 mg/dL — AB (ref 65–99)
Potassium: 3.4 mmol/L — ABNORMAL LOW (ref 3.5–5.1)
Sodium: 137 mmol/L (ref 135–145)

## 2016-02-28 LAB — GLUCOSE, CAPILLARY
GLUCOSE-CAPILLARY: 129 mg/dL — AB (ref 65–99)
GLUCOSE-CAPILLARY: 191 mg/dL — AB (ref 65–99)
Glucose-Capillary: 97 mg/dL (ref 65–99)
Glucose-Capillary: 130 mg/dL — ABNORMAL HIGH (ref 65–99)

## 2016-02-28 LAB — CBC
HCT: 33.7 % — ABNORMAL LOW (ref 39.0–52.0)
HEMOGLOBIN: 10.7 g/dL — AB (ref 13.0–17.0)
MCH: 29.6 pg (ref 26.0–34.0)
MCHC: 31.8 g/dL (ref 30.0–36.0)
MCV: 93.4 fL (ref 78.0–100.0)
Platelets: 113 10*3/uL — ABNORMAL LOW (ref 150–400)
RBC: 3.61 MIL/uL — AB (ref 4.22–5.81)
RDW: 14.7 % (ref 11.5–15.5)
WBC: 6 10*3/uL (ref 4.0–10.5)

## 2016-02-28 MED ORDER — GUAIFENESIN-DM 100-10 MG/5ML PO SYRP
5.0000 mL | ORAL_SOLUTION | ORAL | Status: DC | PRN
  Administered 2016-02-28 (×2): 5 mL via ORAL
  Filled 2016-02-28 (×2): qty 5

## 2016-02-28 MED ORDER — INSULIN ASPART 100 UNIT/ML ~~LOC~~ SOLN
4.0000 [IU] | Freq: Three times a day (TID) | SUBCUTANEOUS | Status: DC
  Administered 2016-02-28 – 2016-03-02 (×7): 4 [IU] via SUBCUTANEOUS

## 2016-02-28 MED ORDER — FUROSEMIDE 40 MG PO TABS
40.0000 mg | ORAL_TABLET | Freq: Every day | ORAL | Status: DC
  Administered 2016-02-29: 40 mg via ORAL
  Filled 2016-02-28 (×2): qty 1

## 2016-02-28 MED ORDER — POTASSIUM CHLORIDE CRYS ER 20 MEQ PO TBCR
40.0000 meq | EXTENDED_RELEASE_TABLET | Freq: Once | ORAL | Status: AC
  Administered 2016-02-28: 40 meq via ORAL
  Filled 2016-02-28: qty 2

## 2016-02-28 NOTE — Progress Notes (Signed)
Spoke w pt and 2 sons. phy there this am had rec hhpt. When speaking w fam they are not sure if will need hhc or short term snf for rehab. Gave them [place] agencies [place] and they will look over and talk w pt's wife their mother. They also would like to speak w sw about shf for rehab close to [place]. Will cont to follow.

## 2016-02-28 NOTE — Progress Notes (Signed)
     SUBJECTIVE: Pt only c/o chest wall pain.   Tele: sinus  BP 135/74 mmHg  Pulse 63  Temp(Src) 97.6 F (36.4 C) (Oral)  Resp 14  Ht 5\' 10"  (1.778 m)  Wt 186 lb 9.6 oz (84.641 kg)  BMI 26.77 kg/m2  SpO2 97%  Intake/Output Summary (Last 24 hours) at 02/28/16 0919 Last data filed at 02/28/16 0759  Gross per 24 hour  Intake  760.5 ml  Output   3860 ml  Net -3099.5 ml    PHYSICAL EXAM General: Well developed, well nourished, in no acute distress. Alert and oriented x 3.  Psych:  Good affect, responds appropriately Neck: No JVD. No masses noted.  Lungs: Clear bilaterally with no wheezes or rhonci noted.  Heart: RRR with no murmurs noted. Abdomen: Bowel sounds are present. Soft, non-tender.  Extremities: No lower extremity edema.   LABS: Basic Metabolic Panel:  Recent Labs  19/14/7805/17/17 0430 02/27/16 0346 02/28/16 0350  NA 140 137 137  K 4.2 3.9 3.4*  CL 105 100* 97*  CO2 25 25 29   GLUCOSE 162* 204* 122*  BUN 31* 34* 35*  CREATININE 1.28* 1.44* 1.46*  CALCIUM 8.5* 8.8* 9.0  MG 1.8 1.5*  --   PHOS 1.9* 2.7  --    CBC:  Recent Labs  02/27/16 0346 02/28/16 0350  WBC 7.3 6.0  HGB 10.7* 10.7*  HCT 32.9* 33.7*  MCV 92.9 93.4  PLT 129* 113*   Current Meds: . amiodarone  400 mg Oral BID  . ampicillin-sulbactam (UNASYN) IV  3 g Intravenous Q6H  . antiseptic oral rinse  7 mL Mouth Rinse BID  . aspirin EC  81 mg Oral Daily  . atorvastatin  80 mg Oral q1800  . carvedilol  3.125 mg Oral BID WC  . feeding supplement  1 Container Oral TID BM  . furosemide  40 mg Intravenous Q12H  . insulin aspart  0-20 Units Subcutaneous TID WC  . insulin aspart  0-5 Units Subcutaneous QHS  . insulin glargine  75 Units Subcutaneous BID  . pantoprazole  40 mg Oral Daily  . sodium chloride flush  10-40 mL Intracatheter Q12H     ASSESSMENT AND PLAN: 70 yo male with history of CAD s/p CABG, ischemic cardiomyopathy with ICD in place and DM was admitted after cardiac arrest/ICD  discharge for VT/VF. Cath showed stable anatomy, no intervention was performed.   1. CAD s/p CABG: Cardiac cath 02/22/16 with no clear culprit for ACS. (occluded native vessels, chronic occlusion SVG-RCA, patent LIMA-LAD and SVG-OM). Continue ASA 81, statin and beta blocker.   2. Cardiac arrest: VT/VF noted on ICD interrogation. Our EP team is now following and has adjusted his device settings. He is now on amiodarone 400 mg po BID. Will need this dose for 13 more days  (400 mg po BID for 2 weeks, 400 mg po daily for 2 weeks then 200 mg daily).   3. Ischemic cardiomyopathy: LVEF=30-35% by echo. This is chronic. Coreg has been restarted. Will restart Ace-inh before discharge if renal function remains stable.   4. Acute on chronic systolic CHF: Volume status is better. IV Lasix started 02/26/16. Good diuresis with 5 liters negative last 48 hours. Will change to po Lasix today.    He could be moved to telemetry today. Will need PT this weekend and then [place]   Verne CarrowChristopher Gilmar Riggs  5/19/20179:19 AM

## 2016-02-28 NOTE — Progress Notes (Signed)
Inpatient Diabetes Program Recommendations  AACE/ADA: New Consensus Statement on Inpatient Glycemic Control (2015)  Target Ranges:  Prepandial:   less than 140 mg/dL      Peak postprandial:   less than 180 mg/dL (1-2 hours)      Critically ill patients:  140 - 180 mg/dL  Results for Baron HamperHARRISON, Ansar (MRN [id number]) as of 02/28/2016 12:08  Ref. Range 02/27/2016 08:16 02/27/2016 11:35 02/27/2016 16:40 02/27/2016 20:36 02/28/2016 07:44  Glucose-Capillary Latest Ref Range: 65-99 mg/dL 962197 (H) 952276 (H) 841246 (H) 202 (H) 97   Review of Glycemic Control  Inpatient Diabetes Program Recommendations:  Noted Novolog correction scale resistant 0-20 units tid with meals. Please consider adding Novolog 4 units meal coverage tid (hold if eats <50%).  Thank you, Billy FischerJudy E. Hopie Pellegrin, RN, MSN, CDE Inpatient Glycemic Control Team Team Pager [phone] (8am-5pm) 02/28/2016 12:13 PM

## 2016-02-28 NOTE — Evaluation (Signed)
Physical Therapy Evaluation Patient Details Name: Ryan Riggs MRN: [id number] DOB: 1946-09-07 Today's Date: 02/28/2016   History of Present Illness  Pt is a 70 y/o F w/ ICD in place who was admitted after cardiac arrest/ICD discharge for VT/VF.  Pt's PMH includes CAD s/p CABG, ischemic cardriomyopathy.    Clinical Impression  Pt admitted with above diagnosis. Pt currently with functional limitations due to the deficits listed below (see PT Problem List). Ryan Riggs presents w/ generalized weakness, impaired balance, and guarded posture due to chest pain.  He currently requires min assist to steady w/ transfers and ambulation.  He will have 24/7 assist/supervision available at home at d/c and has a very supportive family.  Anticipate that pt will progress well w/ increasing mobility. Pt will benefit from skilled PT to increase their independence and safety with mobility to allow discharge to the venue listed below.      Follow Up Recommendations Home health PT;Supervision for mobility/OOB    Equipment Recommendations  None recommended by PT    Recommendations for Other Services       Precautions / Restrictions Precautions Precautions: Fall Restrictions Weight Bearing Restrictions: No      Mobility  Bed Mobility Overal bed mobility: Needs Assistance Bed Mobility: Supine to Sit;Sit to Supine     Supine to sit: Min guard;HOB elevated Sit to supine: Min assist   General bed mobility comments: Cues for log roll technique.  Pt uses bed rail to roll.  Increased time due to pain.  Min assist managing Bil LEs when rolling to back to supine.  Transfers Overall transfer level: Needs assistance Equipment used: None Transfers: Sit to/from Stand Sit to Stand: Min assist         General transfer comment: Pt unsteady and requires min assist.  Pt is slow to sit and stand due to chest pain.  Ambulation/Gait Ambulation/Gait assistance: Min assist;+2 safety/equipment Ambulation  Distance (Feet): 90 Feet Assistive device: None Gait Pattern/deviations: Step-through pattern;Decreased stride length;Drifts right/left;Narrow base of support   Gait velocity interpretation: Below normal speed for age/gender General Gait Details: Min assist to steady at times as pt generally weak and fatigues quickly.  Slightly guarded flexed posture.  Stairs            Wheelchair Mobility    Modified Rankin (Stroke Patients Only)       Balance Overall balance assessment: Needs assistance (denies any previous falls in the past 6 months) Sitting-balance support: No upper extremity supported;Feet supported Sitting balance-Leahy Scale: Fair     Standing balance support: During functional activity;No upper extremity supported Standing balance-Leahy Scale: Poor Standing balance comment: Pt unsteady w/ static standing, requiring min assist at times                             Pertinent Vitals/Pain Pain Assessment: 0-10 Pain Score: 9  Pain Location: chest pain Pain Descriptors / Indicators: Aching;Sore Pain Intervention(s): Monitored during session;Limited activity within patient's tolerance;Repositioned    Home Living Family/patient expects to be discharged to:: Private residence Living Arrangements: Spouse/significant other Available Help at Discharge: Family;Available 24 hours/day Type of Home: House Home Access: Stairs to enter Entrance Stairs-Rails: Left;Right;Can reach both Entrance Stairs-Number of Steps: 4 Home Layout: One level Home Equipment: Walker - 2 wheels;Cane - single point;Bedside commode;Shower seat;Electric scooter Additional Comments: Lives w/ wife.  Two sons live on same property.    Prior Function Level of Independence: Independent with assistive device(s)  Comments: Does not use AD in home and ambulates 100 ft w/ AD ouside of home to workshop where he keeps his scooter that he drives around on his property.  Uses cane when  out in community.  Drives ocassionally, wife typically drives.     Hand Dominance        Extremity/Trunk Assessment   Upper Extremity Assessment: Generalized weakness           Lower Extremity Assessment: Generalized weakness      Cervical / Trunk Assessment: Other exceptions  Communication   Communication: No difficulties  Cognition Arousal/Alertness: Awake/alert Behavior During Therapy: WFL for tasks assessed/performed Overall Cognitive Status: Within Functional Limits for tasks assessed                      General Comments General comments (skin integrity, edema, etc.): VSS    Exercises        Assessment/Plan    PT Assessment Patient needs continued PT services  PT Diagnosis Difficulty walking;Generalized weakness;Acute pain   PT Problem List Decreased strength;Decreased activity tolerance;Decreased balance;Decreased mobility;Decreased knowledge of use of DME;Decreased safety awareness;Pain  PT Treatment Interventions DME instruction;Gait training;Stair training;Functional mobility training;Therapeutic activities;Therapeutic exercise;Balance training;Patient/family education;Modalities   PT Goals (Current goals can be found in the Care Plan section) Acute Rehab PT Goals Patient Stated Goal: to return home PT Goal Formulation: With patient/family Time For Goal Achievement: 03/13/16 Potential to Achieve Goals: Good    Frequency Min 3X/week   Barriers to discharge Inaccessible home environment steps to enter home    Co-evaluation               End of Session Equipment Utilized During Treatment: Gait belt Activity Tolerance: Patient limited by fatigue Patient left: in bed;with call bell/phone within reach;with bed alarm set;with family/visitor present Nurse Communication: Mobility status         Time: [id number] PT Time Calculation (min) (ACUTE ONLY): 29 min   Charges:   PT Evaluation $PT Eval Moderate Complexity: 1 Procedure PT  Treatments $Gait Training: 8-22 mins   PT G Codes:       [name] PT, DPT  Pager: [phone] Phone: [phone] 02/28/2016, 12:26 PM

## 2016-02-28 NOTE — Progress Notes (Signed)
PROGRESS NOTE    Ryan Riggs  ZOX:[id number] DOB: Mar 16, 1946 DOA: 02/22/2016 PCP: No primary care provider on file.   Outpatient Specialists:     Brief Narrative:  70 y/o M with PMH of DM II, CAD s/p CABG, HTN and pacemaker insertion who presented to [facility] on 5/13 after VF arrest. He has never been hospitalized in the [facility] system. Other medical history unknown.   The patient was reportedly at a barn dance and went to the restroom. He was later found down by bystanders. His son performed CPR. He had been feeling poorly for approximately 2 weeks. EMS activated with initial rhythm of VT. He was shocked 2-3 times by his ICD. EMS intubated. He was treated with amiodarone and transported to ER for evaluation. Initial EKG demonstrated ST changes. To cath lab with prior graft disease identified, no interventions. Returned to ICU for hypothermia protocol. Initially admitted to [facility] and transferred to [facility] on 5/18.     Assessment & Plan:   Active Problems:   Cardiac arrest [facility])   Ventricular fibrillation (HCC)   Respiratory failure (HCC)   Acute respiratory failure (HCC)   Abnormal EEG   Type 2 diabetes mellitus with stage 3 chronic kidney disease, with long-term current use of insulin (HCC)   Acute Respiratory Failure - in setting of cardiac arrest with possible aspiration -extubated -unasyn for aspiration PNA  DM-2 uncontrolled -on U500 at home--- increase to 75 mg BID -SSI + novolog   Acute on chronic systolic CHF - volume overloaded.  -IV Lasix started 02/26/16- lasix per cards   Ischemic cardiomyopathy: LVEF=30-35% by echo. This is chronic. Coreg has been restarted. Will restart Ace-inh before discharge if renal function remains stable.   Dysphagia -SLP following -NPO since 5/13-- for MBS 5/18-- DYS 2 diet -has had EGD at [facility] in [place]  VT arrest with CPR in field -AICD shock -EP following -amio per cards  NSTEMI Continue ASA 81, statin and  beta blocker  Rib pain -PRN pain meds  AKI -will request labs from [place] -baseline unknown -follows at the [place]    DVT prophylaxis:  SCD's  Code Status: Full Code   Family Communication: Sons at bedside  Disposition Plan:  tx to 3w tele bed PT eval- home health   Consultants:   PCCM  EP  Cards  Procedures:  LHC 5/13 showing occluded native vessels, chronic occlusion SVG-RCA, patent LIMA-LAD and SVG-OM. No clear culprit lesion EEG 5/13 >>  ECHO 5/13 >> LVEF 30-35%, grade 2 diastolic dysfunction     Subjective: Still with rib pain with deep breathing/coughing  Objective: Filed Vitals:   02/28/16 0745 02/28/16 0747 02/28/16 0800 02/28/16 1224  BP: 135/74 135/74 137/69 101/54  Pulse: 69 63 62 66  Temp:  97.6 F (36.4 C)  97.9 F (36.6 C)  TempSrc:  Oral  Oral  Resp:  14 19 15   Height:      Weight:      SpO2:  97% 97% 96%    Intake/Output Summary (Last 24 hours) at 02/28/16 1306 Last data filed at 02/28/16 0759  Gross per 24 hour  Intake    410 ml  Output   3450 ml  Net  -3040 ml   Filed Weights   02/26/16 0409 02/27/16 0340 02/28/16 0349  Weight: 86.7 kg (191 lb 2.2 oz) 77.111 kg (170 lb) 84.641 kg (186 lb 9.6 oz)    Examination:  General exam: sick appearing, mild distress Respiratory system: Clear to auscultation. Respiratory  effort normal. Cardiovascular system: S1 & S2 heard, RRR. No JVD, murmurs, rubs, gallops or clicks. No pedal edema. Gastrointestinal system: Abdomen is nondistended, soft and nontender. No organomegaly or masses felt. Normal bowel sounds heard. Central nervous system: Alert and oriented. No focal neurological deficits. Extremities: Symmetric 5 x 5 power.     Data Reviewed: I have personally reviewed following labs and imaging studies  CBC:  Recent Labs Lab 02/24/16 0400 02/24/16 0404 02/25/16 0400 02/26/16 0430 02/27/16 0346 02/28/16 0350  WBC 18.8*  --  11.9* 7.8 7.3 6.0  HGB 11.9* 13.6 10.4* 9.9*  10.7* 10.7*  HCT 36.4* 40.0 31.6* 30.6* 32.9* 33.7*  MCV 93.8  --  95.2 94.4 92.9 93.4  PLT 206  --  103* 99* 129* 113*   Basic Metabolic Panel:  Recent Labs Lab 02/23/16 0651  02/24/16 1214 02/25/16 0400 02/26/16 0430 02/27/16 0346 02/28/16 0350  NA 138  < > 135 137 140 137 137  K 4.7  < > 5.1 4.6 4.2 3.9 3.4*  CL 106  < > 104 105 105 100* 97*  CO2 22  < > 18* GLUCOSE 254*  < > 242* 295* 162* 204* 122*  BUN 37*  < > 30* 33* 31* 34* 35*  CREATININE 1.68*  < > 1.74* 1.55* 1.28* 1.44* 1.46*  CALCIUM 8.9  < > 8.3* 8.0* 8.5* 8.8* 9.0  MG 2.0  --   --  1.7 1.8 1.5*  --   PHOS 3.9  --   --  2.1* 1.9* 2.7  --   < > = values in this interval not displayed. GFR: Estimated Creatinine Clearance: 50 mL/min (by C-G formula based on Cr of 1.46). Liver Function Tests:  Recent Labs Lab 02/23/16 0651  AST 94*  ALT 58  ALKPHOS 34*  BILITOT 1.0  PROT 6.7  ALBUMIN 3.5   No results for input(s): LIPASE, AMYLASE in the last 168 hours. No results for input(s): AMMONIA in the last 168 hours. Coagulation Profile:  Recent Labs Lab 02/22/16 2230 02/23/16 0652  INR 1.31 1.30   Cardiac Enzymes:  Recent Labs Lab 02/23/16 1515  TROPONINI 0.35*   BNP (last 3 results) No results for input(s): PROBNP in the last 8760 hours. HbA1C: No results for input(s): HGBA1C in the last 72 hours. CBG:  Recent Labs Lab 02/27/16 1135 02/27/16 1640 02/27/16 2036 02/28/16 0744 02/28/16 1223  GLUCAP 276* 246* 202* 97 129*   Lipid Profile: No results for input(s): CHOL, HDL, LDLCALC, TRIG, CHOLHDL, LDLDIRECT in the last 72 hours. Thyroid Function Tests: No results for input(s): TSH, T4TOTAL, FREET4, T3FREE, THYROIDAB in the last 72 hours. Anemia Panel: No results for input(s): VITAMINB12, FOLATE, FERRITIN, TIBC, IRON, RETICCTPCT in the last 72 hours. Urine analysis: No results found for: COLORURINE, APPEARANCEUR, LABSPEC, PHURINE, GLUCOSEU, HGBUR, BILIRUBINUR, KETONESUR,  PROTEINUR, UROBILINOGEN, NITRITE, LEUKOCYTESUR   Recent Results (from the past 240 hour(s))  MRSA PCR Screening     Status: None   Collection Time: 02/23/16  3:00 AM  Result Value Ref Range Status   MRSA by PCR NEGATIVE NEGATIVE Final    Comment:        The GeneXpert MRSA Assay (FDA approved for NASAL specimens only), is one component of a comprehensive MRSA colonization surveillance program. It is not intended to diagnose MRSA infection nor to guide or monitor treatment for MRSA infections.       Anti-infectives    Start     Dose/Rate Route  Frequency Ordered Stop   02/24/16 0645  Ampicillin-Sulbactam (UNASYN) 3 g in sodium chloride 0.9 % 100 mL IVPB    Comments:  Unasyn 3 g IV q6h for CrCL > 30 mL/min   3 g 100 mL/hr over 60 Minutes Intravenous Every 6 hours 02/24/16 0626         Radiology Studies: Dg Chest Port 1 View  02/27/2016  CLINICAL DATA:  Acute respiratory failure, cardiac arrest EXAM: PORTABLE CHEST 1 VIEW COMPARISON:  Portable chest x-ray of Feb 26, 2016 FINDINGS: There has been interval removal of the left internal jugular venous catheter. The lungs are mildly hypoinflated. The interstitial markings have improved. There is no pneumothorax or significant pleural effusion. Subsegmental atelectasis at the left lung base persists. Inferior right upper lobe density has improved significantly. The cardiac silhouette remains enlarged. The pulmonary vascularity is less engorged. The permanent pacemaker defibrillator is in stable position. IMPRESSION: Improved appearance of the pulmonary interstitium consistent with resolving interstitial edema and/or interstitial pneumonia. Stable cardiomegaly with no more than mild central pulmonary vascular prominence. Electronically Signed   By: David  SwazilandJordan M.D.   On: 02/27/2016 07:28   Dg Swallowing Func-speech Pathology  02/27/2016  Objective Swallowing Evaluation: Type of Study: MBS-Modified Barium Swallow Study Patient Details Name:  Ryan Riggs MRN: [id number] Date of Birth: 06/17/1946 Today's Date: 02/27/2016 Time: SLP Start Time (ACUTE ONLY): 1250-SLP Stop Time (ACUTE ONLY): 1310 SLP Time Calculation (min) (ACUTE ONLY): 20 min Past Medical History: Past Medical History Diagnosis Date . Diabetes mellitus without complication (HCC)  . Hypertension  . Cardiac arrest (HCC) 02/22/2016 . Ventricular fibrillation (HCC) 02/22/2016 . Coronary artery disease  . AICD (automatic cardioverter/defibrillator) present  . Presence of permanent cardiac pacemaker  Past Surgical History: Past Surgical History Procedure Laterality Date . Coronary artery bypass graft     x4 . Pacemaker insertion   . Insert / replace / remove pacemaker   . Vascular surgery   . Cardiac catheterization   . Cardiac catheterization N/A 02/22/2016   Procedure: Left Heart Cath and Coronary Angiography;  Surgeon: Tonny BollmanMichael Cooper, MD;  Location: [facility] INVASIVE CV LAB;  Service: Cardiovascular;  Laterality: N/A; HPI: 70 y/o M with PMH of DM II, CAD s/p CABG, HTN and pacemaker insertion who [facility] after VF arrest. Large volume emesis with concern for aspiration 5/13. ETT 5/13-5/16.  Subjective: improved MS since this am Assessment / Plan / Recommendation CHL IP CLINICAL IMPRESSIONS 02/27/2016 Therapy Diagnosis Mild pharyngeal phase dysphagia;Mild oral phase dysphagia Clinical Impression Pt presents with a mild dysphagia s/p intubation.  There are mild deficits in oral manipulation; reduced laryngeal closure leading to frank penetration of nectars and trace aspiration of thin liquids.  A chin tuck reliably prevented penetration/aspiration with both thin and nectar thick liquids. There was adequate clearance of POs through the pharynx with no retention.  Recommend initiating a dysphagia 2 diet; thin liquids with CHIN TUCK; meds whole in puree.  Pt was able to recall precautions after review.  SLP will follow for safety/toleration.  D/W RN.  Impact on safety and function Mild  aspiration risk   CHL IP TREATMENT RECOMMENDATION 02/27/2016 Treatment Recommendations Therapy as outlined in treatment plan below   Prognosis 02/27/2016 Prognosis for Safe Diet Advancement Good Barriers to Reach Goals -- Barriers/Prognosis Comment -- CHL IP DIET RECOMMENDATION 02/27/2016 SLP Diet Recommendations Dysphagia 2 (Fine chop) solids;Thin liquid Liquid Administration via Cup Medication Administration Whole meds with puree Compensations Slow rate;Small sips/bites;Chin tuck Postural Changes Seated  upright at 90 degrees   CHL IP OTHER RECOMMENDATIONS 02/27/2016 Recommended Consults -- Oral Care Recommendations Oral care BID Other Recommendations --   CHL IP FOLLOW UP RECOMMENDATIONS 02/27/2016 Follow up Recommendations (No Data)   CHL IP FREQUENCY AND DURATION 02/27/2016 Speech Therapy Frequency (ACUTE ONLY) min 3x week Treatment Duration 2 weeks      CHL IP ORAL PHASE 02/27/2016 Oral Phase Impaired Oral - Pudding Teaspoon -- Oral - Pudding Cup -- Oral - Honey Teaspoon -- Oral - Honey Cup -- Oral - Nectar Teaspoon -- Oral - Nectar Cup -- Oral - Nectar Straw -- Oral - Thin Teaspoon -- Oral - Thin Cup -- Oral - Thin Straw -- Oral - Puree -- Oral - Mech Soft Delayed oral transit Oral - Regular -- Oral - Multi-Consistency -- Oral - Pill -- Oral Phase - Comment --  CHL IP PHARYNGEAL PHASE 02/27/2016 Pharyngeal Phase Impaired Pharyngeal- Pudding Teaspoon -- Pharyngeal -- Pharyngeal- Pudding Cup -- Pharyngeal -- Pharyngeal- Honey Teaspoon -- Pharyngeal -- Pharyngeal- Honey Cup -- Pharyngeal -- Pharyngeal- Nectar Teaspoon -- Pharyngeal -- Pharyngeal- Nectar Cup Reduced airway/laryngeal closure;Penetration/Aspiration during swallow Pharyngeal Material enters airway, CONTACTS cords and not ejected out Pharyngeal- Nectar Straw -- Pharyngeal -- Pharyngeal- Thin Teaspoon -- Pharyngeal -- Pharyngeal- Thin Cup Reduced airway/laryngeal closure;Penetration/Aspiration during swallow;Trace aspiration Pharyngeal Material enters airway,  passes BELOW cords without attempt by patient to eject out (silent aspiration);Material enters airway, passes BELOW cords and not ejected out despite cough attempt by patient Pharyngeal- Thin Straw -- Pharyngeal -- Pharyngeal- Puree -- Pharyngeal -- Pharyngeal- Mechanical Soft -- Pharyngeal -- Pharyngeal- Regular -- Pharyngeal -- Pharyngeal- Multi-consistency -- Pharyngeal -- Pharyngeal- Pill -- Pharyngeal -- Pharyngeal Comment chin tuck reliably prevents aspiration  No flowsheet data found. No flowsheet data found. Blenda Mounts Laurice 02/27/2016, 2:20 PM                   Scheduled Meds: . amiodarone  400 mg Oral BID  . ampicillin-sulbactam (UNASYN) IV  3 g Intravenous Q6H  . antiseptic oral rinse  7 mL Mouth Rinse BID  . aspirin EC  81 mg Oral Daily  . atorvastatin  80 mg Oral q1800  . carvedilol  3.125 mg Oral BID WC  . feeding supplement  1 Container Oral TID BM  . [START ON 02/29/2016] furosemide  40 mg Oral Daily  . insulin aspart  0-20 Units Subcutaneous TID WC  . insulin aspart  0-5 Units Subcutaneous QHS  . insulin aspart  4 Units Subcutaneous TID WC  . insulin glargine  75 Units Subcutaneous BID  . pantoprazole  40 mg Oral Daily  . sodium chloride flush  10-40 mL Intracatheter Q12H   Continuous Infusions: . sodium chloride Stopped (02/27/16 0800)     LOS: 6 days    Time spent: 35 min    JESSICA U VANN, DO [facility] Pager [phone]  If 7PM-7AM, please contact night-coverage [email or web address] Password [email or web address] 02/28/2016, 1:06 PM

## 2016-02-28 NOTE — Progress Notes (Signed)
Speech Language Pathology Treatment: Dysphagia  Patient Details Name: Ryan HamperJoseph Riggs MRN: [id number] DOB: 08/14/1946 Today's Date: 02/28/2016 Time: [id number] SLP Time Calculation (min) (ACUTE ONLY): 8 min  Assessment / Plan / Recommendation Clinical Impression  Provided multimodal reinforcement of strategies and precautions to reduce risk of aspiration. With question cue pt was able to verbalize need for chin tuck and family confirms pt was independent with this strategy. SLP repositioned pt and explained rationale for fully upright posture. Pt completed chin tuck in 1005 of trials with adequate timing, but needed min verbal and tactile cues to fully dip chin to chest. Explained nature of silent aspiration to pt and family and encouraged thin to continue chin tuck even if pt did not notice coughing with PO.    HPI        SLP Plan  Continue with current plan of care     Recommendations  Diet recommendations: Dysphagia 2 (fine chop);Thin liquid Liquids provided via: Cup;Straw Medication Administration: Whole meds with puree Compensations: Slow rate;Small sips/bites;Chin tuck Postural Changes and/or Swallow Maneuvers: Chin tuck             Oral Care Recommendations: Oral care BID Follow up Recommendations: Skilled Nursing facility Plan: Continue with current plan of care     GO               Harlon DittyBonnie Redith Drach, MA CCC-SLP [phone]  Claudine MoutonDeBlois, Mariha Sleeper Caroline 02/28/2016, 2:25 PM

## 2016-02-29 LAB — GLUCOSE, CAPILLARY
GLUCOSE-CAPILLARY: 119 mg/dL — AB (ref 65–99)
GLUCOSE-CAPILLARY: 205 mg/dL — AB (ref 65–99)
Glucose-Capillary: 113 mg/dL — ABNORMAL HIGH (ref 65–99)
Glucose-Capillary: 139 mg/dL — ABNORMAL HIGH (ref 65–99)

## 2016-02-29 LAB — CBC
HEMATOCRIT: 34.8 % — AB (ref 39.0–52.0)
HEMOGLOBIN: 11.2 g/dL — AB (ref 13.0–17.0)
MCH: 29.2 pg (ref 26.0–34.0)
MCHC: 32.2 g/dL (ref 30.0–36.0)
MCV: 90.9 fL (ref 78.0–100.0)
Platelets: 153 10*3/uL (ref 150–400)
RBC: 3.83 MIL/uL — AB (ref 4.22–5.81)
RDW: 14.6 % (ref 11.5–15.5)
WBC: 8.7 10*3/uL (ref 4.0–10.5)

## 2016-02-29 LAB — BASIC METABOLIC PANEL
ANION GAP: 14 (ref 5–15)
BUN: 35 mg/dL — ABNORMAL HIGH (ref 6–20)
CHLORIDE: 94 mmol/L — AB (ref 101–111)
CO2: 25 mmol/L (ref 22–32)
Calcium: 9 mg/dL (ref 8.9–10.3)
Creatinine, Ser: 1.33 mg/dL — ABNORMAL HIGH (ref 0.61–1.24)
GFR, EST NON AFRICAN AMERICAN: 53 mL/min — AB (ref >60)
Glucose, Bld: 120 mg/dL — ABNORMAL HIGH (ref 65–99)
POTASSIUM: 3.5 mmol/L (ref 3.5–5.1)
SODIUM: 133 mmol/L — AB (ref 135–145)

## 2016-02-29 LAB — MAGNESIUM: MAGNESIUM: 1.8 mg/dL (ref 1.7–2.4)

## 2016-02-29 MED ORDER — ZOLPIDEM TARTRATE 5 MG PO TABS
5.0000 mg | ORAL_TABLET | Freq: Once | ORAL | Status: AC
  Administered 2016-02-29: 5 mg via ORAL
  Filled 2016-02-29: qty 1

## 2016-02-29 MED ORDER — MENTHOL 3 MG MT LOZG
1.0000 | LOZENGE | OROMUCOSAL | Status: DC | PRN
  Administered 2016-02-29 – 2016-03-01 (×2): 3 mg via ORAL
  Filled 2016-02-29 (×2): qty 9

## 2016-02-29 MED ORDER — POTASSIUM CHLORIDE CRYS ER 20 MEQ PO TBCR
20.0000 meq | EXTENDED_RELEASE_TABLET | Freq: Every day | ORAL | Status: DC
  Administered 2016-02-29 – 2016-03-02 (×3): 20 meq via ORAL
  Filled 2016-02-29 (×3): qty 1

## 2016-02-29 MED ORDER — DOCUSATE SODIUM 100 MG PO CAPS
100.0000 mg | ORAL_CAPSULE | Freq: Two times a day (BID) | ORAL | Status: DC
  Administered 2016-02-29 – 2016-03-02 (×5): 100 mg via ORAL
  Filled 2016-02-29 (×5): qty 1

## 2016-02-29 NOTE — Progress Notes (Signed)
Lenny Pastelom Callahan NP paged for patient for request cepacol losenges for dry scratchy throat from being intubated. [facility] Tim[facility]hy Broski Rn

## 2016-02-29 NOTE — Care Management Important Message (Signed)
Important Message  Patient Details  Name: Ryan Riggs MRN: [id number] Date of Birth: 12/18/1945   Medicare Important Message Given:  Yes    [name] 02/29/2016, 11:08 AM

## 2016-02-29 NOTE — Progress Notes (Signed)
PROGRESS NOTE    Baron HamperJoseph Torosian  ZOX:[id number] DOB: 09/09/1946 DOA: 02/22/2016 PCP: No primary care provider on file.   Outpatient Specialists:     Brief Narrative:  70 y/o M with PMH of DM II, CAD s/p CABG, HTN and pacemaker insertion who [facility] after VF arrest. He has never been hospitalized in the [facility] system. Other medical history unknown.   The patient was reportedly at a barn dance and went to the restroom. He was later found down by bystanders. His son performed CPR. He had been feeling poorly for approximately 2 weeks. EMS activated with initial rhythm of VT. He was shocked 2-3 times by his ICD. EMS intubated. He was treated with amiodarone and transported to ER for evaluation. Initial EKG demonstrated ST changes. To cath lab with prior graft disease identified, no interventions. Returned to ICU for hypothermia protocol. [facility] and [facility] on 5/18.     Assessment & Plan:   Active Problems:   Cardiac arrest [facility](HCC)   Ventricular fibrillation (HCC)   Respiratory failure (HCC)   Acute respiratory failure (HCC)   Abnormal EEG   Type 2 diabetes mellitus with stage 3 chronic kidney disease, with long-term current use of insulin (HCC)   Acute Respiratory Failure - in setting of cardiac arrest with possible aspiration -extubated -unasyn for aspiration PNA  DM-2 uncontrolled -on U500 at home--- increase to 75 mg BID -SSI + novolog   Acute on chronic systolic CHF - volume overloaded.  -IV Lasix started 02/26/16-- changed to PO-- lasix per cards   Ischemic cardiomyopathy: LVEF=30-35% by echo. This is chronic. Coreg has been restarted. Will restart Ace before discharge if renal function remains stable.   Dysphagia -SLP following -NPO since 5/13-- for MBS 5/18-- DYS 2 diet -[facility] [place]  VT arrest with CPR in field -AICD shock -EP following -amio per cards  NSTEMI Continue ASA 81,  statin and beta blocker  Rib pain -PRN pain meds  AKI -[place] [facility] [place]     DVT prophylaxis:  SCD's  Code Status: Full Code   Family Communication: Son at bedside  Disposition Plan:  PT eval- home health vs SNF (wife is debilitated)   Consultants:   PCCM  EP  Cards  Procedures:  LHC 5/13 showing occluded native vessels, chronic occlusion SVG-RCA, patent LIMA-LAD and SVG-OM. No clear culprit lesion EEG 5/13 >>  ECHO 5/13 >> LVEF 30-35%, grade 2 diastolic dysfunction     Subjective: Moving better Pain there but mildly improved  Objective: Filed Vitals:   02/28/16 1622 02/28/16 2000 02/28/16 2208 02/29/16 0420  BP: 129/63 112/54 125/51 125/68  Pulse: 64 56 51 65  Temp: 98.1 F (36.7 C) 98.9 F (37.2 C) 97.7 F (36.5 C) 98.6 F (37 C)  TempSrc: Oral Oral Oral Oral  Resp: 13 17 14 17   Height:      Weight:    82.2 kg (181 lb 3.5 oz)  SpO2: 93% 94% 95% 93%    Intake/Output Summary (Last 24 hours) at 02/29/16 0903 Last data filed at 02/29/16 0434  Gross per 24 hour  Intake    440 ml  Output    400 ml  Net     40 ml   Filed Weights   02/27/16 0340 02/28/16 0349 02/29/16 0420  Weight: 77.111 kg (170 lb) 84.641 kg (186 lb 9.6 oz) 82.2 kg (181 lb 3.5 oz)  Examination:  General exam: NAD Respiratory system: Clear to auscultation. Respiratory effort normal. Cardiovascular system: S1 & S2 heard, RRR. No JVD, murmurs, rubs, gallops or clicks. No pedal edema. Gastrointestinal system: Abdomen is nondistended, soft and nontender. No organomegaly or masses felt. Normal bowel sounds heard. Central nervous system: Alert and oriented. No focal neurological deficits. Extremities: Symmetric 5 x 5 power.     Data Reviewed: I have personally reviewed following labs and imaging studies  CBC:  Recent Labs Lab 02/25/16 0400 02/26/16 0430 02/27/16 0346 02/28/16 0350 02/29/16 0558  WBC 11.9* 7.8 7.3 6.0 8.7  HGB 10.4*  9.9* 10.7* 10.7* 11.2*  HCT 31.6* 30.6* 32.9* 33.7* 34.8*  MCV 95.2 94.4 92.9 93.4 90.9  PLT 103* 99* 129* 113* 153   Basic Metabolic Panel:  Recent Labs Lab 02/23/16 0651  02/25/16 0400 02/26/16 0430 02/27/16 0346 02/28/16 0350 02/29/16 0558  NA 138  < > 137 140 137 137 133*  K 4.7  < > 4.6 4.2 3.9 3.4* 3.5  CL 106  < > 105 105 100* 97* 94*  CO2 22  < > GLUCOSE 254*  < > 295* 162* 204* 122* 120*  BUN 37*  < > 33* 31* 34* 35* 35*  CREATININE 1.68*  < > 1.55* 1.28* 1.44* 1.46* 1.33*  CALCIUM 8.9  < > 8.0* 8.5* 8.8* 9.0 9.0  MG 2.0  --  1.7 1.8 1.5*  --  1.8  PHOS 3.9  --  2.1* 1.9* 2.7  --   --   < > = values in this interval not displayed. GFR: Estimated Creatinine Clearance: 54.9 mL/min (by C-G formula based on Cr of 1.33). Liver Function Tests:  Recent Labs Lab 02/23/16 0651  AST 94*  ALT 58  ALKPHOS 34*  BILITOT 1.0  PROT 6.7  ALBUMIN 3.5   No results for input(s): LIPASE, AMYLASE in the last 168 hours. No results for input(s): AMMONIA in the last 168 hours. Coagulation Profile:  Recent Labs Lab 02/22/16 2230 02/23/16 0652  INR 1.31 1.30   Cardiac Enzymes:  Recent Labs Lab 02/23/16 1515  TROPONINI 0.35*   BNP (last 3 results) No results for input(s): PROBNP in the last 8760 hours. HbA1C: No results for input(s): HGBA1C in the last 72 hours. CBG:  Recent Labs Lab 02/28/16 0744 02/28/16 1223 02/28/16 1629 02/28/16 2114 02/29/16 0732  GLUCAP 97 129* 130* 191* 119*   Lipid Profile: No results for input(s): CHOL, HDL, LDLCALC, TRIG, CHOLHDL, LDLDIRECT in the last 72 hours. Thyroid Function Tests: No results for input(s): TSH, T4TOTAL, FREET4, T3FREE, THYROIDAB in the last 72 hours. Anemia Panel: No results for input(s): VITAMINB12, FOLATE, FERRITIN, TIBC, IRON, RETICCTPCT in the last 72 hours. Urine analysis: No results found for: COLORURINE, APPEARANCEUR, LABSPEC, PHURINE, GLUCOSEU, HGBUR, BILIRUBINUR, KETONESUR, PROTEINUR,  UROBILINOGEN, NITRITE, LEUKOCYTESUR   Recent Results (from the past 240 hour(s))  MRSA PCR Screening     Status: None   Collection Time: 02/23/16  3:00 AM  Result Value Ref Range Status   MRSA by PCR NEGATIVE NEGATIVE Final    Comment:        The GeneXpert MRSA Assay (FDA approved for NASAL specimens only), is one component of a comprehensive MRSA colonization surveillance program. It is not intended to diagnose MRSA infection nor to guide or monitor treatment for MRSA infections.       Anti-infectives    Start     Dose/Rate Route Frequency Ordered Stop  02/24/16 0645  Ampicillin-Sulbactam (UNASYN) 3 g in sodium chloride 0.9 % 100 mL IVPB    Comments:  Unasyn 3 g IV q6h for CrCL > 30 mL/min   3 g 100 mL/hr over 60 Minutes Intravenous Every 6 hours 02/24/16 0626         Radiology Studies: Dg Swallowing Func-speech Pathology  02/27/2016  Objective Swallowing Evaluation: Type of Study: MBS-Modified Barium Swallow Study Patient Details Name: Keanthony Poole MRN: [id number] Date of Birth: 1945/11/25 Today's Date: 02/27/2016 Time: SLP Start Time (ACUTE ONLY): 1250-SLP Stop Time (ACUTE ONLY): 1310 SLP Time Calculation (min) (ACUTE ONLY): 20 min Past Medical History: Past Medical History Diagnosis Date . Diabetes mellitus without complication (HCC)  . Hypertension  . Cardiac arrest (HCC) 02/22/2016 . Ventricular fibrillation (HCC) 02/22/2016 . Coronary artery disease  . AICD (automatic cardioverter/defibrillator) present  . Presence of permanent cardiac pacemaker  Past Surgical History: Past Surgical History Procedure Laterality Date . Coronary artery bypass graft     x4 . Pacemaker insertion   . Insert / replace / remove pacemaker   . Vascular surgery   . Cardiac catheterization   . Cardiac catheterization N/A 02/22/2016   Procedure: Left Heart Cath and Coronary Angiography;  Surgeon: Tonny Bollman, MD;  Location: [facility] INVASIVE CV LAB;  Service: Cardiovascular;  Laterality: N/A; HPI: 70 y/o M  with PMH of DM II, CAD s/p CABG, HTN and pacemaker insertion who presented to [facility] on 5/13 after VF arrest. Large volume emesis with concern for aspiration 5/13. ETT 5/13-5/16.  Subjective: improved MS since this am Assessment / Plan / Recommendation CHL IP CLINICAL IMPRESSIONS 02/27/2016 Therapy Diagnosis Mild pharyngeal phase dysphagia;Mild oral phase dysphagia Clinical Impression Pt presents with a mild dysphagia s/p intubation.  There are mild deficits in oral manipulation; reduced laryngeal closure leading to frank penetration of nectars and trace aspiration of thin liquids.  A chin tuck reliably prevented penetration/aspiration with both thin and nectar thick liquids. There was adequate clearance of POs through the pharynx with no retention.  Recommend initiating a dysphagia 2 diet; thin liquids with CHIN TUCK; meds whole in puree.  Pt was able to recall precautions after review.  SLP will follow for safety/toleration.  D/W RN.  Impact on safety and function Mild aspiration risk   CHL IP TREATMENT RECOMMENDATION 02/27/2016 Treatment Recommendations Therapy as outlined in treatment plan below   Prognosis 02/27/2016 Prognosis for Safe Diet Advancement Good Barriers to Reach Goals -- Barriers/Prognosis Comment -- CHL IP DIET RECOMMENDATION 02/27/2016 SLP Diet Recommendations Dysphagia 2 (Fine chop) solids;Thin liquid Liquid Administration via Cup Medication Administration Whole meds with puree Compensations Slow rate;Small sips/bites;Chin tuck Postural Changes Seated upright at 90 degrees   CHL IP OTHER RECOMMENDATIONS 02/27/2016 Recommended Consults -- Oral Care Recommendations Oral care BID Other Recommendations --   CHL IP FOLLOW UP RECOMMENDATIONS 02/27/2016 Follow up Recommendations (No Data)   CHL IP FREQUENCY AND DURATION 02/27/2016 Speech Therapy Frequency (ACUTE ONLY) min 3x week Treatment Duration 2 weeks      CHL IP ORAL PHASE 02/27/2016 Oral Phase Impaired Oral - Pudding Teaspoon -- Oral - Pudding Cup -- Oral  - Honey Teaspoon -- Oral - Honey Cup -- Oral - Nectar Teaspoon -- Oral - Nectar Cup -- Oral - Nectar Straw -- Oral - Thin Teaspoon -- Oral - Thin Cup -- Oral - Thin Straw -- Oral - Puree -- Oral - Mech Soft Delayed oral transit Oral - Regular -- Oral - Multi-Consistency -- Oral - Pill -- Oral  Phase - Comment --  CHL IP PHARYNGEAL PHASE 02/27/2016 Pharyngeal Phase Impaired Pharyngeal- Pudding Teaspoon -- Pharyngeal -- Pharyngeal- Pudding Cup -- Pharyngeal -- Pharyngeal- Honey Teaspoon -- Pharyngeal -- Pharyngeal- Honey Cup -- Pharyngeal -- Pharyngeal- Nectar Teaspoon -- Pharyngeal -- Pharyngeal- Nectar Cup Reduced airway/laryngeal closure;Penetration/Aspiration during swallow Pharyngeal Material enters airway, CONTACTS cords and not ejected out Pharyngeal- Nectar Straw -- Pharyngeal -- Pharyngeal- Thin Teaspoon -- Pharyngeal -- Pharyngeal- Thin Cup Reduced airway/laryngeal closure;Penetration/Aspiration during swallow;Trace aspiration Pharyngeal Material enters airway, passes BELOW cords without attempt by patient to eject out (silent aspiration);Material enters airway, passes BELOW cords and not ejected out despite cough attempt by patient Pharyngeal- Thin Straw -- Pharyngeal -- Pharyngeal- Puree -- Pharyngeal -- Pharyngeal- Mechanical Soft -- Pharyngeal -- Pharyngeal- Regular -- Pharyngeal -- Pharyngeal- Multi-consistency -- Pharyngeal -- Pharyngeal- Pill -- Pharyngeal -- Pharyngeal Comment chin tuck reliably prevents aspiration  No flowsheet data found. No flowsheet data found. Blenda Mounts Laurice 02/27/2016, 2:20 PM                   Scheduled Meds: . amiodarone  400 mg Oral BID  . ampicillin-sulbactam (UNASYN) IV  3 g Intravenous Q6H  . antiseptic oral rinse  7 mL Mouth Rinse BID  . aspirin EC  81 mg Oral Daily  . atorvastatin  80 mg Oral q1800  . carvedilol  3.125 mg Oral BID WC  . feeding supplement  1 Container Oral TID BM  . furosemide  40 mg Oral Daily  . insulin aspart  0-20 Units  Subcutaneous TID WC  . insulin aspart  0-5 Units Subcutaneous QHS  . insulin aspart  4 Units Subcutaneous TID WC  . insulin glargine  75 Units Subcutaneous BID  . pantoprazole  40 mg Oral Daily  . potassium chloride  20 mEq Oral Daily   Continuous Infusions: . sodium chloride Stopped (02/27/16 0800)     LOS: 7 days    Time spent: 35 min    Sophiah Rolin U Ashlynne Shetterly, DO [facility] Pager [phone]  If 7PM-7AM, please contact night-coverage [email or web address] Password [email or web address] 02/29/2016, 9:03 AM

## 2016-02-29 NOTE — Progress Notes (Signed)
Subjective:  Only complaint is significant chest wall soreness.  No shortness of breath or ischemic type chest pain.  Objective:  Vital Signs in the last 24 hours: BP 125/68 mmHg  Pulse 65  Temp(Src) 98.6 F (37 C) (Oral)  Resp 17  Ht 5\' 10"  (1.778 m)  Wt 82.2 kg (181 lb 3.5 oz)  BMI 26.00 kg/m2  SpO2 93%  Physical Exam: Pleasant white male currently in no acute distress Lungs:  Clear  Cardiac:  Regular rhythm, normal S1 and S2, no S3, chest wall is tender  Abdomen:  Soft, nontender, no masses Extremities:  No edema present  Intake/Output from previous day: 05/19 0701 - 05/20 0700 In: 440 [P.O.:240; IV Piggyback:200] Out: 1400 [Urine:1400] Weight Filed Weights   02/27/16 0340 02/28/16 0349 02/29/16 0420  Weight: 77.111 kg (170 lb) 84.641 kg (186 lb 9.6 oz) 82.2 kg (181 lb 3.5 oz)    Lab Results: Basic Metabolic Panel:  Recent Labs  16/07/9604/19/17 0350 02/29/16 0558  NA 137 133*  K 3.4* 3.5  CL 97* 94*  CO2 29 25  GLUCOSE 122* 120*  BUN 35* 35*  CREATININE 1.46* 1.33*    CBC:  Recent Labs  02/28/16 0350 02/29/16 0558  WBC 6.0 8.7  HGB 10.7* 11.2*  HCT 33.7* 34.8*  MCV 93.4 90.9  PLT 113* 153   Telemetry: Sinus bradycardia, no ventricular arrhythmias noted  Assessment/Plan:  1.  Recent cardiac arrest currently on amiodarone loading dose 2.  Ischemic cardiomyopathy with stable anatomy this admission no intervention planned 3.  Acute on chronic systolic heart failure currently stable  Recommendations:  Continue physical therapy this weekend.  Patient currently on the floor complaining mainly of chest wall soreness.  Recheck renal panel.  Currently on oral diuretics.   Darden PalmerW. Spencer Zettie Gootee, Jr.  MD [facility] Cardiology  02/29/2016, 8:08 AM

## 2016-03-01 LAB — BASIC METABOLIC PANEL
Anion gap: 10 (ref 5–15)
BUN: 32 mg/dL — ABNORMAL HIGH (ref 6–20)
CALCIUM: 9.1 mg/dL (ref 8.9–10.3)
CHLORIDE: 99 mmol/L — AB (ref 101–111)
CO2: 27 mmol/L (ref 22–32)
CREATININE: 1.52 mg/dL — AB (ref 0.61–1.24)
GFR calc non Af Amer: 45 mL/min — ABNORMAL LOW (ref >60)
GFR, EST AFRICAN AMERICAN: 53 mL/min — AB (ref >60)
GLUCOSE: 130 mg/dL — AB (ref 65–99)
Potassium: 3.8 mmol/L (ref 3.5–5.1)
Sodium: 136 mmol/L (ref 135–145)

## 2016-03-01 LAB — GLUCOSE, CAPILLARY
GLUCOSE-CAPILLARY: 124 mg/dL — AB (ref 65–99)
GLUCOSE-CAPILLARY: 217 mg/dL — AB (ref 65–99)
Glucose-Capillary: 74 mg/dL (ref 65–99)
Glucose-Capillary: 142 mg/dL — ABNORMAL HIGH (ref 65–99)
Glucose-Capillary: 194 mg/dL — ABNORMAL HIGH (ref 65–99)

## 2016-03-01 MED ORDER — ZOLPIDEM TARTRATE 5 MG PO TABS
5.0000 mg | ORAL_TABLET | Freq: Every evening | ORAL | Status: DC | PRN
  Administered 2016-03-01: 5 mg via ORAL
  Filled 2016-03-01: qty 1

## 2016-03-01 MED ORDER — FUROSEMIDE 20 MG PO TABS
20.0000 mg | ORAL_TABLET | Freq: Every day | ORAL | Status: DC
  Administered 2016-03-01 – 2016-03-02 (×2): 20 mg via ORAL
  Filled 2016-03-01: qty 1

## 2016-03-01 MED ORDER — HYDROCODONE-ACETAMINOPHEN 5-325 MG PO TABS
1.0000 | ORAL_TABLET | ORAL | Status: DC | PRN
  Administered 2016-03-01 – 2016-03-02 (×6): 1 via ORAL
  Filled 2016-03-01 (×6): qty 1

## 2016-03-01 NOTE — Progress Notes (Addendum)
Subjective:  Coughed up a big hunk of mucus earlier today but otherwise feels well.  His largest complaint is significant chest soreness and rib trauma from CPR.  No ischemic chest pain, no shortness of breath.  Objective:  Vital Signs in the last 24 hours: BP 134/67 mmHg  Pulse 61  Temp(Src) 97.7 F (36.5 C) (Oral)  Resp 18  Ht 5\' 10"  (1.778 m)  Wt 81.874 kg (180 lb 8 oz)  BMI 25.90 kg/m2  SpO2 99%  Physical Exam: Pleasant white male currently in no acute distress Lungs:  Clear  Cardiac:  Regular rhythm, normal S1 and S2, no S3, chest wall is tender  Abdomen:  Soft, nontender, no masses Extremities:  No edema present  Intake/Output from previous day: 05/20 0701 - 05/21 0700 In: 1140 [P.O.:840; IV Piggyback:300] Out: 2451 [Urine:2450; Stool:1] Weight Filed Weights   02/28/16 0349 02/29/16 0420 03/01/16 0422  Weight: 84.641 kg (186 lb 9.6 oz) 82.2 kg (181 lb 3.5 oz) 81.874 kg (180 lb 8 oz)    Lab Results: Basic Metabolic Panel:  Recent Labs  16/07/9604/20/17 0558 03/01/16 0414  NA 133* 136  K 3.5 3.8  CL 94* 99*  CO2 25 27  GLUCOSE 120* 130*  BUN 35* 32*  CREATININE 1.33* 1.52*    CBC:  Recent Labs  02/28/16 0350 02/29/16 0558  WBC 6.0 8.7  HGB 10.7* 11.2*  HCT 33.7* 34.8*  MCV 93.4 90.9  PLT 113* 153   Telemetry: Sinus bradycardia, no ventricular arrhythmias noted  Assessment/Plan:  1.  Recent cardiac arrest currently on amiodarone loading dose 2.  Ischemic cardiomyopathy with stable anatomy this admission no intervention planned 3.  Acute on chronic systolic heart failure currently stable 4.  Stage III chronic kidney disease with some worsening with elevation of creatinine today  Recommendations:  May need to cut diuretics back a little bit.  Wife has questions about discharge planning.  The last PT and OT to see about whether he can go home with home health care or will need short-term in rehabilitation and skilled nursing.  Darden PalmerW. Spencer Taino Maertens, Jr.   MD [facility] Cardiology  03/01/2016, 8:53 AM

## 2016-03-01 NOTE — Progress Notes (Signed)
PROGRESS NOTE    Ryan Riggs  UJW:[id number] DOB: 1946/06/07 DOA: 02/22/2016 PCP: No primary care provider on file.   Outpatient Specialists:     Brief Narrative:  70 y/o M with PMH of DM II, CAD s/p CABG, HTN and pacemaker insertion who presented to [facility] on 5/13 after VF arrest. He has never been hospitalized in the [facility] system. Other medical history unknown.   The patient was reportedly at a barn dance and went to the restroom. He was later found down by bystanders. His son performed CPR. He had been feeling poorly for approximately 2 weeks. EMS activated with initial rhythm of VT. He was shocked 2-3 times by his ICD. EMS intubated. He was treated with amiodarone and transported to ER for evaluation. Initial EKG demonstrated ST changes. To cath lab with prior graft disease identified, no interventions. Returned to ICU for hypothermia protocol. Initially admitted to [facility] and transferred to [facility] on 5/18.     Assessment & Plan:   Active Problems:   Cardiac arrest [facility])   Ventricular fibrillation (HCC)   Respiratory failure (HCC)   Acute respiratory failure (HCC)   Abnormal EEG   Type 2 diabetes mellitus with stage 3 chronic kidney disease, with long-term current use of insulin (HCC)   Acute Respiratory Failure - in setting of cardiac arrest with possible aspiration -extubated -unasyn for aspiration PNA  DM-2 uncontrolled -on U500 at home--- increase to 75 mg BID -SSI + novolog   Acute on chronic systolic CHF - volume overloaded.  -IV Lasix started 02/26/16-- changed to PO-- lasix per cards- decrease   Ischemic cardiomyopathy: LVEF=30-35% by echo. This is chronic. Coreg has been restarted. Will restart Ace before discharge if renal function remains stable.   Dysphagia -SLP following -NPO since 5/13-- for MBS 5/18-- DYS 2 diet -has had EGD at [facility] in [place]  VT arrest with CPR in field -AICD shock -EP following -amio per cards  NSTEMI Continue  ASA 81, statin and beta blocker  Rib pain -PRN pain meds  AKI -will request labs from [place] -baseline unknown     DVT prophylaxis:  SCD's  Code Status: Full Code   Family Communication: Son at bedside  Disposition Plan:  Home with home health in AM if stable   Consultants:   PCCM  EP  Cards  Procedures:  [facility] 5/13 showing occluded native vessels, chronic occlusion SVG-RCA, patent LIMA-LAD and SVG-OM. No clear culprit lesion EEG 5/13 >>  ECHO 5/13 >> LVEF 30-35%, grade 2 diastolic dysfunction     Subjective: Wife wants to take home with home health  Objective: Filed Vitals:   02/29/16 0735 02/29/16 1419 03/01/16 0422 03/01/16 0826  BP:  120/52 125/26 134/67  Pulse: 57 60 68 61  Temp:  98.5 F (36.9 C) 97.7 F (36.5 C)   TempSrc:  Oral Oral   Resp: Height:      Weight:   81.874 kg (180 lb 8 oz)   SpO2: 94% 97% 99%     Intake/Output Summary (Last 24 hours) at 03/01/16 1345 Last data filed at 03/01/16 0900  Gross per 24 hour  Intake   1020 ml  Output   2450 ml  Net  -1430 ml   Filed Weights   02/28/16 0349 02/29/16 0420 03/01/16 0422  Weight: 84.641 kg (186 lb 9.6 oz) 82.2 kg (181 lb 3.5 oz) 81.874 kg (180 lb 8 oz)    Examination:  General exam: NAD Respiratory system:  Clear to auscultation. Respiratory effort normal. Cardiovascular system: S1 & S2 heard, RRR. No JVD, murmurs, rubs, gallops or clicks. No pedal edema. Gastrointestinal system: Abdomen is nondistended, soft and nontender. No organomegaly or masses felt. Normal bowel sounds heard. Central nervous system: Alert and oriented. No focal neurological deficits. Extremities: Symmetric 5 x 5 power.     Data Reviewed: I have personally reviewed following labs and imaging studies  CBC:  Recent Labs Lab 02/25/16 0400 02/26/16 0430 02/27/16 0346 02/28/16 0350 02/29/16 0558  WBC 11.9* 7.8 7.3 6.0 8.7  HGB 10.4* 9.9* 10.7* 10.7* 11.2*  HCT 31.6* 30.6* 32.9* 33.7*  34.8*  MCV 95.2 94.4 92.9 93.4 90.9  PLT 103* 99* 129* 113* 153   Basic Metabolic Panel:  Recent Labs Lab 02/25/16 0400 02/26/16 0430 02/27/16 0346 02/28/16 0350 02/29/16 0558 03/01/16 0414  NA 137 140 137 137 133* 136  K 4.6 4.2 3.9 3.4* 3.5 3.8  CL 105 105 100* 97* 94* 99*  CO2 22 25 25 29 25 27   GLUCOSE 295* 162* 204* 122* 120* 130*  BUN 33* 31* 34* 35* 35* 32*  CREATININE 1.55* 1.28* 1.44* 1.46* 1.33* 1.52*  CALCIUM 8.0* 8.5* 8.8* 9.0 9.0 9.1  MG 1.7 1.8 1.5*  --  1.8  --   PHOS 2.1* 1.9* 2.7  --   --   --    GFR: Estimated Creatinine Clearance: 48 mL/min (by C-G formula based on Cr of 1.52). Liver Function Tests: No results for input(s): AST, ALT, ALKPHOS, BILITOT, PROT, ALBUMIN in the last 168 hours. No results for input(s): LIPASE, AMYLASE in the last 168 hours. No results for input(s): AMMONIA in the last 168 hours. Coagulation Profile: No results for input(s): INR, PROTIME in the last 168 hours. Cardiac Enzymes:  Recent Labs Lab 02/23/16 1515  TROPONINI 0.35*   BNP (last 3 results) No results for input(s): PROBNP in the last 8760 hours. HbA1C: No results for input(s): HGBA1C in the last 72 hours. CBG:  Recent Labs Lab 02/29/16 1655 02/29/16 2124 03/01/16 0428 03/01/16 0744 03/01/16 1131  GLUCAP 113* 139* 124* 74 142*   Lipid Profile: No results for input(s): CHOL, HDL, LDLCALC, TRIG, CHOLHDL, LDLDIRECT in the last 72 hours. Thyroid Function Tests: No results for input(s): TSH, T4TOTAL, FREET4, T3FREE, THYROIDAB in the last 72 hours. Anemia Panel: No results for input(s): VITAMINB12, FOLATE, FERRITIN, TIBC, IRON, RETICCTPCT in the last 72 hours. Urine analysis: No results found for: COLORURINE, APPEARANCEUR, LABSPEC, PHURINE, GLUCOSEU, HGBUR, BILIRUBINUR, KETONESUR, PROTEINUR, UROBILINOGEN, NITRITE, LEUKOCYTESUR   Recent Results (from the past 240 hour(s))  MRSA PCR Screening     Status: None   Collection Time: 02/23/16  3:00 AM  Result  Value Ref Range Status   MRSA by PCR NEGATIVE NEGATIVE Final    Comment:        The GeneXpert MRSA Assay (FDA approved for NASAL specimens only), is one component of a comprehensive MRSA colonization surveillance program. It is not intended to diagnose MRSA infection nor to guide or monitor treatment for MRSA infections.       Anti-infectives    Start     Dose/Rate Route Frequency Ordered Stop   02/24/16 0645  Ampicillin-Sulbactam (UNASYN) 3 g in sodium chloride 0.9 % 100 mL IVPB    Comments:  Unasyn 3 g IV q6h for CrCL > 30 mL/min   3 g 100 mL/hr over 60 Minutes Intravenous Every 6 hours 02/24/16 0626         Radiology Studies:  No results found.      Scheduled Meds: . amiodarone  400 mg Oral BID  . ampicillin-sulbactam (UNASYN) IV  3 g Intravenous Q6H  . antiseptic oral rinse  7 mL Mouth Rinse BID  . aspirin EC  81 mg Oral Daily  . atorvastatin  80 mg Oral q1800  . carvedilol  3.125 mg Oral BID WC  . docusate sodium  100 mg Oral BID  . feeding supplement  1 Container Oral TID BM  . furosemide  20 mg Oral Daily  . insulin aspart  0-20 Units Subcutaneous TID WC  . insulin aspart  0-5 Units Subcutaneous QHS  . insulin aspart  4 Units Subcutaneous TID WC  . insulin glargine  75 Units Subcutaneous BID  . pantoprazole  40 mg Oral Daily  . potassium chloride  20 mEq Oral Daily   Continuous Infusions: . sodium chloride Stopped (02/27/16 0800)     LOS: 8 days    Time spent: 25 min    Jaylea Plourde Juanetta GoslingU Jong Rickman, DO [facility] Pager [phone]  If 7PM-7AM, please contact night-coverage [email or web address] Password [email or web address] 03/01/2016, 1:45 PM

## 2016-03-02 LAB — BASIC METABOLIC PANEL
Anion gap: 8 (ref 5–15)
BUN: 24 mg/dL — AB (ref 6–20)
CHLORIDE: 104 mmol/L (ref 101–111)
CO2: 24 mmol/L (ref 22–32)
Calcium: 9 mg/dL (ref 8.9–10.3)
Creatinine, Ser: 1.36 mg/dL — ABNORMAL HIGH (ref 0.61–1.24)
GFR calc Af Amer: >60 mL/min (ref >60)
GFR calc non Af Amer: 52 mL/min — ABNORMAL LOW (ref >60)
GLUCOSE: 74 mg/dL (ref 65–99)
POTASSIUM: 3.6 mmol/L (ref 3.5–5.1)
Sodium: 136 mmol/L (ref 135–145)

## 2016-03-02 LAB — CBC
HEMATOCRIT: 34.7 % — AB (ref 39.0–52.0)
HEMOGLOBIN: 11 g/dL — AB (ref 13.0–17.0)
MCH: 29.4 pg (ref 26.0–34.0)
MCHC: 31.7 g/dL (ref 30.0–36.0)
MCV: 92.8 fL (ref 78.0–100.0)
PLATELETS: 133 10*3/uL — AB (ref 150–400)
RBC: 3.74 MIL/uL — ABNORMAL LOW (ref 4.22–5.81)
RDW: 14.5 % (ref 11.5–15.5)
WBC: 6.7 10*3/uL (ref 4.0–10.5)

## 2016-03-02 LAB — GLUCOSE, CAPILLARY
GLUCOSE-CAPILLARY: 65 mg/dL (ref 65–99)
Glucose-Capillary: 189 mg/dL — ABNORMAL HIGH (ref 65–99)
Glucose-Capillary: 207 mg/dL — ABNORMAL HIGH (ref 65–99)

## 2016-03-02 MED ORDER — BOOST / RESOURCE BREEZE PO LIQD
1.0000 | Freq: Three times a day (TID) | ORAL | Status: AC
Start: 2016-03-02 — End: ?

## 2016-03-02 MED ORDER — CARVEDILOL 6.25 MG PO TABS: 6.2500 mg | ORAL_TABLET | Freq: Two times a day (BID) | ORAL | Status: AC

## 2016-03-02 MED ORDER — LISINOPRIL 2.5 MG PO TABS: 2.5000 mg | ORAL_TABLET | Freq: Every day | ORAL | Status: DC

## 2016-03-02 MED ORDER — POTASSIUM CHLORIDE CRYS ER 20 MEQ PO TBCR: 20.0000 meq | EXTENDED_RELEASE_TABLET | Freq: Every day | ORAL | Status: AC

## 2016-03-02 MED ORDER — INSULIN GLARGINE 100 UNIT/ML ~~LOC~~ SOLN
60.0000 [IU] | Freq: Two times a day (BID) | SUBCUTANEOUS | Status: DC
Start: 2016-03-02 — End: 2016-03-02
  Administered 2016-03-02: 60 [IU] via SUBCUTANEOUS
  Filled 2016-03-02 (×3): qty 0.6

## 2016-03-02 MED ORDER — CARVEDILOL 6.25 MG PO TABS: 6.2500 mg | ORAL_TABLET | Freq: Two times a day (BID) | ORAL | Status: DC

## 2016-03-02 MED ORDER — AMIODARONE HCL 200 MG PO TABS: ORAL_TABLET | ORAL | Status: AC

## 2016-03-02 MED ORDER — HYDROCODONE-ACETAMINOPHEN 5-325 MG PO TABS: 1.0000 | ORAL_TABLET | ORAL | Status: AC | PRN

## 2016-03-02 MED ORDER — SPIRONOLACTONE 25 MG PO TABS: 25.0000 mg | ORAL_TABLET | Freq: Every day | ORAL | Status: DC

## 2016-03-02 MED ORDER — INSULIN REGULAR HUMAN (CONC) 500 UNIT/ML ~~LOC~~ SOPN
70.0000 [IU] | PEN_INJECTOR | Freq: Two times a day (BID) | SUBCUTANEOUS | Status: AC
Start: 2016-03-02 — End: ?

## 2016-03-02 NOTE — Progress Notes (Signed)
Subjective:  Having pleuritic CP from broken ribs but no ischemic CP. Ambulating with PT  Objective:  Temp:  [97.6 F (36.4 C)-98.2 F (36.8 C)] 97.9 F (36.6 C) (05/22 0458) Pulse Rate:  [57-67] 66 (05/22 0458) Resp:  [14-16] 14 (05/22 0458) BP: (114-131)/(54-71) 114/71 mmHg (05/22 0458) SpO2:  [95 %-96 %] 95 % (05/22 0458) Weight:  [179 lb 3.7 oz (81.3 kg)] 179 lb 3.7 oz (81.3 kg) (05/22 0458) Weight change: -1 lb 4.3 oz (-0.574 kg)  Intake/Output from previous day: 05/21 0701 - 05/22 0700 In: 1120 [P.O.:720; IV Piggyback:400] Out: 2100 [Urine:2100]  Intake/Output from this shift: Total I/O In: 360 [P.O.:360] Out: -   Physical Exam: General appearance: alert and no distress Neck: no adenopathy, no carotid bruit, no JVD, supple, symmetrical, trachea midline and thyroid not enlarged, symmetric, no tenderness/mass/nodules Lungs: clear to auscultation bilaterally Heart: regular rate and rhythm, S1, S2 normal, no murmur, click, rub or gallop Extremities: extremities normal, atraumatic, no cyanosis or edema  Lab Results: Results for orders placed or performed during the hospital encounter of 02/22/16 (from the past 48 hour(s))  Glucose, capillary     Status: Abnormal   Collection Time: 02/29/16  4:55 PM  Result Value Ref Range   Glucose-Capillary 113 (H) 65 - 99 mg/dL   Comment 1 Notify RN    Comment 2 Document in Chart   Glucose, capillary     Status: Abnormal   Collection Time: 02/29/16  9:24 PM  Result Value Ref Range   Glucose-Capillary 139 (H) 65 - 99 mg/dL  Basic metabolic panel     Status: Abnormal   Collection Time: 03/01/16  4:14 AM  Result Value Ref Range   Sodium 136 135 - 145 mmol/L   Potassium 3.8 3.5 - 5.1 mmol/L   Chloride 99 (L) 101 - 111 mmol/L   CO2 27 22 - 32 mmol/L   Glucose, Bld 130 (H) 65 - 99 mg/dL   BUN 32 (H) 6 - 20 mg/dL   Creatinine, Ser 1.52 (H) 0.61 - 1.24 mg/dL   Calcium 9.1 8.9 - 10.3 mg/dL   GFR calc non Af Amer 45 (L) >60  mL/min   GFR calc Af Amer 53 (L) >60 mL/min    Comment: (NOTE) The eGFR has been calculated using the CKD EPI equation. This calculation has not been validated in all clinical situations. eGFR's persistently <60 mL/min signify possible Chronic Kidney Disease.    Anion gap 10 5 - 15  Glucose, capillary     Status: Abnormal   Collection Time: 03/01/16  4:28 AM  Result Value Ref Range   Glucose-Capillary 124 (H) 65 - 99 mg/dL  Glucose, capillary     Status: None   Collection Time: 03/01/16  7:44 AM  Result Value Ref Range   Glucose-Capillary 74 65 - 99 mg/dL  Glucose, capillary     Status: Abnormal   Collection Time: 03/01/16 11:31 AM  Result Value Ref Range   Glucose-Capillary 142 (H) 65 - 99 mg/dL   Comment 1 Notify RN    Comment 2 Document in Chart   Glucose, capillary     Status: Abnormal   Collection Time: 03/01/16  4:30 PM  Result Value Ref Range   Glucose-Capillary 194 (H) 65 - 99 mg/dL   Comment 1 Notify RN    Comment 2 Document in Chart   Glucose, capillary     Status: Abnormal   Collection Time: 03/01/16  9:47 PM  Result  Value Ref Range   Glucose-Capillary 217 (H) 65 - 99 mg/dL  Basic metabolic panel     Status: Abnormal   Collection Time: 03/02/16  4:54 AM  Result Value Ref Range   Sodium 136 135 - 145 mmol/L   Potassium 3.6 3.5 - 5.1 mmol/L   Chloride 104 101 - 111 mmol/L   CO2 24 22 - 32 mmol/L   Glucose, Bld 74 65 - 99 mg/dL   BUN 24 (H) 6 - 20 mg/dL   Creatinine, Ser 1.36 (H) 0.61 - 1.24 mg/dL   Calcium 9.0 8.9 - 10.3 mg/dL   GFR calc non Af Amer 52 (L) >60 mL/min   GFR calc Af Amer >60 >60 mL/min    Comment: (NOTE) The eGFR has been calculated using the CKD EPI equation. This calculation has not been validated in all clinical situations. eGFR's persistently <60 mL/min signify possible Chronic Kidney Disease.    Anion gap 8 5 - 15  CBC     Status: Abnormal   Collection Time: 03/02/16  4:54 AM  Result Value Ref Range   WBC 6.7 4.0 - 10.5 K/uL   RBC  3.74 (L) 4.22 - 5.81 MIL/uL   Hemoglobin 11.0 (L) 13.0 - 17.0 g/dL   HCT 34.7 (L) 39.0 - 52.0 %   MCV 92.8 78.0 - 100.0 fL   MCH 29.4 26.0 - 34.0 pg   MCHC 31.7 30.0 - 36.0 g/dL   RDW 14.5 11.5 - 15.5 %   Platelets 133 (L) 150 - 400 K/uL  Glucose, capillary     Status: None   Collection Time: 03/02/16  6:00 AM  Result Value Ref Range   Glucose-Capillary 65 65 - 99 mg/dL  Glucose, capillary     Status: Abnormal   Collection Time: 03/02/16  7:33 AM  Result Value Ref Range   Glucose-Capillary 189 (H) 65 - 99 mg/dL  Glucose, capillary     Status: Abnormal   Collection Time: 03/02/16 11:33 AM  Result Value Ref Range   Glucose-Capillary 207 (H) 65 - 99 mg/dL    Imaging: Imaging results have been reviewed  Tele- SR with PVCs (I personally reviewed)  Assessment/Plan:   1. Active Problems: 2.   Cardiac arrest ([place]) 3.   Ventricular fibrillation ([place]) 4.   Respiratory failure ([place]) 5.   Acute respiratory failure ([place]) 6.   Abnormal EEG 7.   Type 2 diabetes mellitus with stage 3 chronic kidney disease, with long-term current use of insulin ([place]) 8.   Time Spent Directly with Patient:  20 minutes  Length of Stay:  LOS: 9 days   Pt admitted 5?13 with witnessed SCD. H/O ISCM s/p remote CABG. EF 30-35%. H/O ICD . Cath w/o culprit lesions. Med Rx recommended. EP saw and adjusted ICD settings, began Amio load. I suspect that this event was related to electrolyte abn from prior GI illness/diarrhea . K 3.6 and Mg 1.9. He was on increased dose of coreg, ACE and spironolactone. Exam benign. Can prob increase coreg to 6.25 mg PO BID, stop ;asix and start ACE-I and aldactone back. Has pleuritic CP from Fx ribs and CPR. OK for DC home with home health care. Close follow up with his EP and cardiologist in [place].  Quay Burow 03/02/2016, 12:05 PM

## 2016-03-02 NOTE — Progress Notes (Signed)
Physical Therapy Treatment Patient Details Name: Ryan Riggs MRN: [id number] DOB: 1946-03-06 Today's Date: 03/02/2016    History of Present Illness Pt is a 70 y.o. with ICD in place who was admitted after cardiac arrest.  Pt's PMH includes CAD s/p CABG, DM, HTN.    PT Comments    Pt is up to walk with increasing confidence and family assisting him when PT arrived. He is able to control gait with the IV pole but did have one LOB at the entrance to his room.  Pt was not aware it was happening, PT caught him and talked with him enough to see he did not feel the shift.  Needs to focus on high level balance activities and gait on unlevel surfaces.  Follow Up Recommendations  Home health PT;Supervision for mobility/OOB     Equipment Recommendations  None recommended by PT    Recommendations for Other Services       Precautions / Restrictions Precautions Precautions: Fall (telemetry) Restrictions Weight Bearing Restrictions: No    Mobility  Bed Mobility               General bed mobility comments: not assessed but asked him about this for home and states he is planning to sleep in a recliner  Transfers Overall transfer level: Needs assistance Equipment used:  (pushed IV pole) Transfers: Sit to/from [employer] Sit to Stand: Min guard Stand pivot transfers: Min guard       General transfer comment: able to stand and sit but struggling without use of arms  Ambulation/Gait Ambulation/Gait assistance: Min guard Ambulation Distance (Feet): 200 Feet Assistive device:  (IV pole) Gait Pattern/deviations: Step-through pattern Gait velocity: reduced Gait velocity interpretation: Below normal speed for age/gender General Gait Details: one LOB at end of walk in his room with min assist needed to steady.  Pt is not concerned about this but sounds like a pattern with PT visits.   Stairs Stairs:  (pt declined )          Wheelchair Mobility     Modified Rankin (Stroke Patients Only)       Balance Overall balance assessment: Needs assistance Sitting-balance support: Feet supported Sitting balance-Leahy Scale: Fair     Standing balance support: Single extremity supported Standing balance-Leahy Scale: Poor Standing balance comment: device is steadying to allow him to walk safely                    Cognition Arousal/Alertness: Awake/alert Behavior During Therapy: WFL for tasks assessed/performed Overall Cognitive Status: Within Functional Limits for tasks assessed       Memory: Decreased short-term memory              Exercises      General Comments General comments (skin integrity, edema, etc.): VSS      Pertinent Vitals/Pain Pain Assessment: Faces Pain Score: 8  Faces Pain Scale: Hurts little more Pain Location: ribcage Pain Descriptors / Indicators: Aching Pain Intervention(s): Limited activity within patient's tolerance;Monitored during session;Premedicated before session;Repositioned    Home Living Family/patient expects to be discharged to:: Private residence Living Arrangements: Spouse/significant other Available Help at Discharge: Family;Available 24 hours/day Type of Home: House Home Access: Stairs to enter Entrance Stairs-Rails: Left;Right;Can reach both Home Layout: One level Home Equipment: Environmental consultant - 2 wheels;Cane - single point;Bedside commode;Shower Engineer, manufacturing - 4 wheels Additional Comments: Lives with wife.  Two sons live on same property.    Prior Function Level of Independence: Independent with assistive  device(s)      Comments: Does not use AD in home and ambulates 100 ft with AD ouside of home to workshop where he keeps his scooter that he drives around on his property.  Uses cane when out in community.  Drives ocassionally, wife typically drives.   PT Goals (current goals can now be found in the care plan section) Acute Rehab PT Goals Patient Stated  Goal: go home Progress towards PT goals: Progressing toward goals    Frequency  Min 3X/week    PT Plan Current plan remains appropriate    Co-evaluation             End of Session Equipment Utilized During Treatment: Gait belt Activity Tolerance: Patient limited by fatigue Patient left: in chair;with call bell/phone within reach;with nursing/sitter in room;with family/visitor present     Time: [id number] PT Time Calculation (min) (ACUTE ONLY): 36 min  Charges:  $Gait Training: 8-22 mins $Therapeutic Activity: 8-22 mins                    G Codes:      Ivar DrapeStout, Michi Herrmann E 03/02/2016, 12:39 PM    Samul Dadauth Brody Bonneau, PT MS Acute Rehab Dept. Number: [facility] [phone] and [facility] [phone]

## 2016-03-02 NOTE — Care Management Note (Addendum)
Case Management Note  Patient Details  Name: Ryan Riggs MRN: [id number] Date of Birth: 02/28/1946  Subjective/Objective:  Pt admitted for Cardiac Arrest. Pt is from home with support of wife. At time of visit son at the bedside. Pt has DME: Hospital Bed, 3n1, RW, Cane and WC. No DME needs identified a this time.                   Action/Plan: [place] [facility] [place] and he has used [facility]. Pt would like to use them again. CM did make referral, awaiting call back from agency for availability. CM will continue to monitor.   Expected Discharge Date:                  Expected Discharge Plan:  Home w Home Health Services  In-House Referral:  NA  Discharge planning Services  CM Consult  Post Acute Care Choice:  Home Health Choice offered to:  Patient  DME Arranged:  N/A DME Agency:  NA  HH Arranged:  PT, SLP HH Agency:  [facility].  Status of Service:  Completed, signed off  Medicare Important Message Given:  Yes Date Medicare IM Given:    Medicare IM give by:    Date Additional Medicare IM Given:    Additional Medicare Important Message give by:     If discussed at Long Length of Stay Meetings, dates discussed:    Additional Comments:[id number] Tomi BambergerBrenda Graves-Bigelow, RN,BSN [phone] CM did speak with pt and wife. Both agreeable [facility]. CM did make referral with [facility] and SOC to begin within 24-48 hrs post d/c. No further needs at this time.    1236 03-02-16 Tomi BambergerBrenda Graves-Bigelow, RN,BSN [phone] Insurance not in network for [facility]., CM will refer back to pt with choice options. [facility] [place] [facility] [place]  are next two options. CM did reach out to both to see if has availability and insurance to cover. Will continue to monitor.   Gala LewandowskyGraves-Bigelow, Jamille Yoshino Kaye, RN 03/02/2016, 12:22 PM

## 2016-03-02 NOTE — Discharge Summary (Addendum)
Physician Discharge Summary  Ryan HamperJoseph Riggs ZOX:[id number] DOB: 10/22/1945 DOA: 02/22/2016  PCP: No primary care provider on file.  Admit date: 02/22/2016 Discharge date: 03/02/2016   Recommendations for Outpatient Follow-Up:   1. Follow up with Carola Frostiffany Adcock- PCP- fax [phone]; please send discharge information to both: Patria Maneavid Beard- Cardiology- [facility] and Dr. Charise KillianLevitzsky- EP [facility] 2. Amiodarone taper 3. EP follow up as defibrillator on recall list 4. No driving 6 months 5. Incentive spirometry 6. Home health: PT/OT SLP 7. 24/7 supervision by family 8. BMP 1 week   Discharge Diagnosis:   Active Problems:   Cardiac arrest [facility](HCC)   Ventricular fibrillation (HCC)   Respiratory failure (HCC)   Acute respiratory failure (HCC)   Type 2 diabetes mellitus with stage 3 chronic kidney disease, with long-term current use of insulin [facility](HCC)   Discharge disposition:  Home with home health  Discharge Condition: Improved.  Diet recommendation: Low sodium, heart healthy.  Carbohydrate-modified.   Wound care: None.   History of Present Illness:   70 y/o M with PMH of DM II, CAD s/p CABG, HTN and pacemaker insertion who [facility] after VF arrest. He has never been [facility] in the [facility]. Other medical history unknown.   The patient was reportedly at a barn dance and went to the restroom. He was later found down by bystanders. His son performed CPR. He had been feeling poorly for approximately 2 weeks. EMS activated with initial rhythm of VT. He was shocked 2-3 times by his ICD. EMS intubated. He was treated with amiodarone and transported to ER for evaluation. Initial EKG demonstrated ST changes. The patient was taken directly to the cath lab where he was found to have graft stenosis, no interventions performed. The patient was returned to ICU for further management. After arrival, he had an episode of vomiting  concerning for aspiration.   PCCM called for ICU admission. Unknown medical, social history as no family available at time of admission.    Hospital Course by Problem:   Acute Respiratory Failure - in setting of cardiac arrest with possible aspiration -extubated -s/p unasyn for aspiration PNA  DM-2 uncontrolled -on U500 at home 140 units BID-- cut to 70 unit BID -diabetic diet  Acute on chronic systolic CHF - volume overloaded.  -IV Lasix started 02/26/16-- changed to PO and then d/c'd by cards and change back to home aldactone  Ischemic cardiomyopathy: LVEF=30-35% by echo. This is chronic. Coreg has been restarted. Will restart Ace at d/c -BMP 1 week  Dysphagia -SLP following - for MBS 5/18-- DYS 2 diet -home health SLP  VT arrest with CPR in field -AICD shock -amio per cards-- will need close EP follow up as device on re-call list  NSTEMI Continue ASA 81, statin and beta blocker  Rib pain -PRN pain meds  AKI -baseline unknown -BMP 1 week with PCP    Medical Consultants:    PCCM  EP  CARDS   Discharge Exam:   Filed Vitals:   03/01/16 2000 03/02/16 0458  BP: 131/71 114/71  Pulse: 67 66  Temp: 97.6 F (36.4 C) 97.9 F (36.6 C)  Resp:  14   Filed Vitals:   03/01/16 0826 03/01/16 1419 03/01/16 2000 03/02/16 0458  BP: 134/67 121/54 131/71 114/71  Pulse: 61 57 67 66  Temp:  98.2 F (36.8 C) 97.6 F (36.4 C) 97.9 F (36.6 C)  TempSrc:  Oral Oral Oral  Resp:  16  14  Height:      Weight:    81.3 kg (179 lb 3.7 oz)  SpO2:  95% 96% 95%    Gen:  NAD- anxious to go home   The results of significant diagnostics from this hospitalization (including imaging, microbiology, ancillary and laboratory) are listed below for reference.     Procedures and Diagnostic Studies:   Dg Chest Port 1 View  03-13-16  CLINICAL DATA:  Central line placement. EXAM: PORTABLE CHEST 1 VIEW COMPARISON:  02/22/2016 FINDINGS: Endotracheal tube 4.7 cm above the  carina. Nasogastric tube courses into the stomach and off the inferior portion of the film as tip is not visualized. Left IJ central venous catheter has tip obliquely oriented over the expected region of the SVC. Left-sided pacemaker unchanged. Sternotomy wires unchanged. Patient slightly rotated to the right. Lungs are hypoinflated without focal consolidation or effusion. No left-sided pneumothorax. Mild stable cardiomegaly. Remainder of the exam is unchanged. IMPRESSION: Hypoinflation without acute cardiopulmonary disease. Mild stable cardiomegaly. Tubes and lines as described. Electronically Signed   By: Elberta Fortis M.D.   On: Mar 13, 2016 14:05   Portable Chest X-ray 1 View  03-13-2016  CLINICAL DATA:  70 year old male with history of cardiac arrest status post endotracheal tube placement. EXAM: PORTABLE CHEST 1 VIEW COMPARISON:  No priors. FINDINGS: An endotracheal tube is in place with tip 2.0 cm above the carina. A nasogastric tube is seen extending into the stomach, however, the tip of the nasogastric tube extends below the lower margin of the image. Transcutaneous defibrillator pads project over the left hemithorax. Left-sided pacemaker/AICD in place with lead tip projecting over the expected location of the right ventricular apex. Orthopedic fixation hardware in the lower cervical spine incompletely visualized. Lung volumes are low. Bibasilar opacities (left greater than right), favored to reflect areas of subsegmental atelectasis. No definite pleural effusions. Crowding of the pulmonary vasculature, without frank pulmonary edema. Heart size appears for the mildly enlarged, likely accentuated by low lung volumes and AP portable technique. Upper mediastinal contours are within normal limits. Status post median sternotomy for CABG. IMPRESSION: 1. Postoperative changes and support apparatus, as above. 2. Low lung volumes with probable bibasilar subsegmental atelectasis. 3. Mild cardiomegaly. Electronically  Signed   By: Trudie Reed M.D.   On: 2016-03-13 07:05     Labs:   Basic Metabolic Panel:  Recent Labs Lab 02/25/16 0400 02/26/16 0430 02/27/16 0346 02/28/16 0350 02/29/16 0558 03/01/16 0414 03/02/16 0454  NA 137 140 137 137 133* 136 136  K 4.6 4.2 3.9 3.4* 3.5 3.8 3.6  CL 105 105 100* 97* 94* 99* 104  CO2 22 25 25 29 25 27 24   GLUCOSE 295* 162* 204* 122* 120* 130* 74  BUN 33* 31* 34* 35* 35* 32* 24*  CREATININE 1.55* 1.28* 1.44* 1.46* 1.33* 1.52* 1.36*  CALCIUM 8.0* 8.5* 8.8* 9.0 9.0 9.1 9.0  MG 1.7 1.8 1.5*  --  1.8  --   --   PHOS 2.1* 1.9* 2.7  --   --   --   --    GFR Estimated Creatinine Clearance: 53.7 mL/min (by C-G formula based on Cr of 1.36). Liver Function Tests: No results for input(s): AST, ALT, ALKPHOS, BILITOT, PROT, ALBUMIN in the last 168 hours. No results for input(s): LIPASE, AMYLASE in the last 168 hours. No results for input(s): AMMONIA in the last 168 hours. Coagulation profile No results for input(s): INR, PROTIME in the last 168 hours.  CBC:  Recent Labs Lab  02/26/16 0430 02/27/16 0346 02/28/16 0350 02/29/16 0558 03/02/16 0454  WBC 7.8 7.3 6.0 8.7 6.7  HGB 9.9* 10.7* 10.7* 11.2* 11.0*  HCT 30.6* 32.9* 33.7* 34.8* 34.7*  MCV 94.4 92.9 93.4 90.9 92.8  PLT 99* 129* 113* 153 133*   Cardiac Enzymes: No results for input(s): CKTOTAL, CKMB, CKMBINDEX, TROPONINI in the last 168 hours. BNP: Invalid input(s): POCBNP CBG:  Recent Labs Lab 03/01/16 1630 03/01/16 2147 03/02/16 0600 03/02/16 0733 03/02/16 1133  GLUCAP 194* 217* 65 189* 207*   D-Dimer No results for input(s): DDIMER in the last 72 hours. Hgb A1c No results for input(s): HGBA1C in the last 72 hours. Lipid Profile No results for input(s): CHOL, HDL, LDLCALC, TRIG, CHOLHDL, LDLDIRECT in the last 72 hours. Thyroid function studies No results for input(s): TSH, T4TOTAL, T3FREE, THYROIDAB in the last 72 hours.  Invalid input(s): FREET3 Anemia work up No results for  input(s): VITAMINB12, FOLATE, FERRITIN, TIBC, IRON, RETICCTPCT in the last 72 hours. Microbiology Recent Results (from the past 240 hour(s))  MRSA PCR Screening     Status: None   Collection Time: 02/23/16  3:00 AM  Result Value Ref Range Status   MRSA by PCR NEGATIVE NEGATIVE Final    Comment:        The GeneXpert MRSA Assay (FDA approved for NASAL specimens only), is one component of a comprehensive MRSA colonization surveillance program. It is not intended to diagnose MRSA infection nor to guide or monitor treatment for MRSA infections.      Discharge Instructions:       Discharge Instructions    Discharge instructions    Complete by:  As directed   Home health- PT/OT/SLP 24 hour supervision DYS 2 diet with thin liquids (low salt and sugar free) BMP/CBC 1 week Check blood sugar and bring log to PCP-- medication has been adjusted     Increase activity slowly    Complete by:  As directed             Medication List    STOP taking these medications        cetirizine 10 MG tablet  Commonly known as:  ZYRTEC     clopidogrel 75 MG tablet  Commonly known as:  PLAVIX     fenofibrate 160 MG tablet     ferrous gluconate 324 MG tablet  Commonly known as:  FERGON     fluticasone 50 MCG/ACT nasal spray  Commonly known as:  FLONASE     furosemide 40 MG tablet  Commonly known as:  LASIX     gabapentin 300 MG capsule  Commonly known as:  NEURONTIN     isosorbide mononitrate 30 MG 24 hr tablet  Commonly known as:  IMDUR     sertraline 100 MG tablet  Commonly known as:  ZOLOFT     tamsulosin 0.4 MG Caps capsule  Commonly known as:  FLOMAX      TAKE these medications        amiodarone 200 MG tablet  Commonly known as:  PACERONE  400 BID x 10 days followed by 400 mg daily x 2 weeks followed by 200 mg daily     aspirin EC 81 MG tablet  Take 81 mg by mouth at bedtime.     atorvastatin 80 MG tablet  Commonly known as:  LIPITOR  Take 80 mg by mouth at  bedtime.     carvedilol 6.25 MG tablet  Commonly known as:  COREG  Take 1 tablet (  6.25 mg total) by mouth 2 (two) times daily with a meal.     feeding supplement Liqd  Take 1 Container by mouth 3 (three) times daily between meals.     Fish Oil 1000 MG Caps  Take 2,000 mg by mouth 2 (two) times daily.     HYDROcodone-acetaminophen 5-325 MG tablet  Commonly known as:  NORCO/VICODIN  Take 1 tablet by mouth every 4 (four) hours as needed for moderate pain.     insulin regular human CONCENTRATED 500 UNIT/ML kwikpen  Commonly known as:  HUMULIN R U-500 KWIKPEN  Inject 70 Units into the skin 2 (two) times daily. Morning and at bedtime     lisinopril 2.5 MG tablet  Commonly known as:  PRINIVIL,ZESTRIL  Take 2.5 mg by mouth at bedtime.     omeprazole 20 MG capsule  Commonly known as:  PRILOSEC  Take 20 mg by mouth at bedtime.     potassium chloride SA 20 MEQ tablet  Commonly known as:  K-DUR,KLOR-CON  Take 1 tablet (20 mEq total) by mouth daily.     spironolactone 25 MG tablet  Commonly known as:  ALDACTONE  Take 25 mg by mouth daily.       Follow-up Information    Please follow up.   Why:  Physical Therapy      Please follow up.   Why:  Micheline Chapman, Dr. Trena Platt       Time coordinating discharge: 35 min  Signed:  Rachael Art   [facility] 03/02/2016, 2:40 PM

## 2016-03-02 NOTE — Progress Notes (Signed)
Upon entering room to administer scheduled meds-pt diaphoretic and c/o nervousness.  CBG 65.  Pt was given cereal and milk as requested. Will continue to monitor. Dierdre HighmanHall, Deanie Jupiter Marie, RN

## 2016-03-02 NOTE — Progress Notes (Signed)
Inpatient Diabetes Program Recommendations  AACE/ADA: New Consensus Statement on Inpatient Glycemic Control (2015)  Target Ranges:  Prepandial:   less than 140 mg/dL      Peak postprandial:   less than 180 mg/dL (1-2 hours)      Critically ill patients:  140 - 180 mg/dL   Results for Ryan Riggs, Ryan Riggs (MRN [id number]) as of 03/02/2016 10:34  Ref. Range 03/01/2016 07:44 03/01/2016 11:31 03/01/2016 16:30 03/01/2016 21:47  Glucose-Capillary Latest Ref Range: 65-99 mg/dL 74 829142 (H) 562194 (H) 130217 (H)   Results for Ryan Riggs, Ryan Riggs (MRN [id number]) as of 03/02/2016 10:34  Ref. Range 03/02/2016 06:00 03/02/2016 07:33  Glucose-Capillary Latest Ref Range: 65-99 mg/dL 65 295189 (H)    Admit with: VFib Arrest  History: DM  Home DM Meds: U-500 concentrated insulin- 140 units bid  Current Insulin Orders: Lantus 60 units bid      Novolog Resistant Correction Scale/ SSI (0-20 units) TID AC + HS      Novolog 4 units tidwc      -Spoke with Dr. Benjamine MolaVann by phone regarding patient's current CBGs and plan for transition to home.  Dr. Benjamine MolaVann would like to send patient home on U-500 insulin.  Note patient has been receiving approximately 50% of the amount of insulin he normally takes at home (received 150 units total of Lantus yesterday (05/21) and received 19 units total of Novolog throughout the day yesterday (05/21).    -Note patient currently receiving dysphagic diet and only consuming about 50% of meals.  -Based on the above information, recommend discharging patient on 50% of his home dose of U-500 insulin with close follow-up with his Endocrinologist soon after discharge to further titrate and adjust the U-500 insulin.  U-500 insulin- 70 units bid      --Will follow patient during hospitalization--  Ambrose FinlandJeannine Johnston Pippa Hanif RN, MSN, CDE Diabetes Coordinator Inpatient Glycemic Control Team Team Pager: [phone] (8a-5p)

## 2016-03-02 NOTE — Evaluation (Signed)
Occupational Therapy Evaluation Patient Details Name: Ryan Riggs MRN: [id number] DOB: 04/06/1946 Today's Date: 03/02/2016    History of Present Illness Pt is a 70 y.o. with ICD in place who was admitted after cardiac arrest.  Pt's PMH includes CAD s/p CABG, DM, HTN.   Clinical Impression   Pt admitted with above. Pt independent with ADLs, PTA. Spouse present in session and pt/spouse in agreement for OT to sign off on pt as spouse plans to assist with ADLs at home.     Follow Up Recommendations  No OT follow up;Supervision/Assistance - 24 hour    Equipment Recommendations  None recommended by OT    Recommendations for Other Services       Precautions / Restrictions Precautions Precautions: Fall Restrictions Weight Bearing Restrictions: No      Mobility Bed Mobility               General bed mobility comments: not assessed  Transfers Overall transfer level: Needs assistance   Transfers: Sit to/from Stand Sit to Stand: Min guard              Balance      Unsteady with single leg stance while standing.                                       ADL Overall ADL's : Needs assistance/impaired                     Lower Body Dressing: Sit to/from stand;Min guard   Toilet Transfer: Min guard;Ambulation (sit to stand from chair)           Functional mobility during ADLs: Min guard General ADL Comments: Educated on safety such as sitting for LB ADLs, safe footwear, rugs/items on floor. Suggested holding pillow during coughing etc.. due to rib pain.      Vision     Perception     Praxis      Pertinent Vitals/Pain Pain Assessment: 0-10 Pain Score: 8  Pain Location: ribs Pain Descriptors / Indicators:  (hurt) Pain Intervention(s): Monitored during session     Hand Dominance     Extremity/Trunk Assessment Upper Extremity Assessment Upper Extremity Assessment: Generalized weakness (pain when OT applied pressure to  arms to check strength)   Lower Extremity Assessment Lower Extremity Assessment: Defer to PT evaluation       Communication Communication Communication: No difficulties   Cognition Arousal/Alertness: Awake/alert Behavior During Therapy: WFL for tasks assessed/performed Overall Cognitive Status: Within Functional Limits for tasks assessed                     General Comments       Exercises       Shoulder Instructions      Home Living Family/patient expects to be discharged to:: Private residence Living Arrangements: Spouse/significant other Available Help at Discharge: Family;Available 24 hours/day Type of Home: House Home Access: Stairs to enter Entergy CorporationEntrance Stairs-Number of Steps: 4 Entrance Stairs-Rails: Left;Right;Can reach both Home Layout: One level     Bathroom Shower/Tub: Chief Strategy OfficerTub/shower unit   Bathroom Toilet: Standard     Home Equipment: Environmental consultantWalker - 2 wheels;Cane - single point;Bedside commode;Shower Engineer, manufacturingseat;Electric scooter;Walker - 4 wheels   Additional Comments: Lives with wife.  Two sons live on same property.      Prior Functioning/Environment Level of Independence: Independent with assistive device(s)  Comments: Does not use AD in home and ambulates 100 ft with AD ouside of home to workshop where he keeps his scooter that he drives around on his property.  Uses cane when out in community.  Drives ocassionally, wife typically drives.    OT Diagnosis: Acute pain   OT Problem List:     OT Treatment/Interventions:      OT Goals(Current goals can be found in the care plan section) Acute Rehab OT Goals Patient Stated Goal: go home  OT Frequency:     Barriers to D/C:            Co-evaluation              End of Session Equipment Utilized During Treatment: Gait belt  Activity Tolerance: Patient tolerated treatment well Patient left: in chair;with call bell/phone within reach;with family/visitor present   Time: 6045-4098 OT Time  Calculation (min): 21 min Charges:  OT General Charges $OT Visit: 1 Procedure OT Evaluation $OT Eval Moderate Complexity: 1 Procedure G-CodesEarlie Raveling OTR/L [phone] 03/02/2016, 10:03 AM

## 2016-03-02 NOTE — Progress Notes (Signed)
Speech Language Pathology Treatment: Dysphagia  Patient Details Name: Ryan Riggs MRN: [id number] DOB: Aug 13, 1946 Today's Date: 03/02/2016 Time: 6773-7366 SLP Time Calculation (min) (ACUTE ONLY): 17 min  Assessment / Plan / Recommendation Clinical Impression  Pt with much improved MS since last seen by SLP services.  Phonation is strong.  Trials of regular solids tolerated well with improved attention, mastication.  Thin liquids swallowed in large volume with head in neutral position and no s/s of aspiration.  Dysphagia has resolved.  Recommend advancing diet to regular, thin liquids; meds whole in liquid.  No further SLP f/u warranted.  Pt/family agree.    HPI HPI: 70 y/o M with PMH of DM II, CAD s/p CABG, HTN and pacemaker insertion who presented to [facility] on 5/13 after VF arrest. Large volume emesis with concern for aspiration 5/13. ETT 5/13-5/16.       SLP Plan  All goals met     Recommendations  Diet recommendations: Regular;Thin liquid Liquids provided via: Cup;Straw Medication Administration: Whole meds with liquid Supervision: Patient able to self feed             Oral Care Recommendations: Oral care BID Follow up Recommendations: None Plan: All goals met     GO              Lisbeth Puller L. Tivis Ringer, [place] CCC/SLP Pager [phone]   Juan Quam Laurice 03/02/2016, 3:05 PM

## 2016-03-02 NOTE — Plan of Care (Signed)
Problem: Pain Managment: Goal: General experience of comfort will improve Outcome: Completed/Met Date Met:  03/02/16 Pt pain is controled with current  PO medications.

## 2016-09-11 DEATH — deceased
